# Patient Record
Sex: Female | Born: 1935 | Race: Black or African American | Hispanic: No | Marital: Married | Smoking: Never smoker
Health system: Southern US, Community
[De-identification: ages and names within clinical notes are randomized; demographics above are authoritative.]

## PROBLEM LIST (undated history)

## (undated) DIAGNOSIS — F419 Anxiety disorder, unspecified: Secondary | ICD-10-CM

## (undated) DIAGNOSIS — E119 Type 2 diabetes mellitus without complications: Secondary | ICD-10-CM

## (undated) DIAGNOSIS — H409 Unspecified glaucoma: Secondary | ICD-10-CM

## (undated) DIAGNOSIS — E079 Disorder of thyroid, unspecified: Secondary | ICD-10-CM

## (undated) DIAGNOSIS — H9190 Unspecified hearing loss, unspecified ear: Secondary | ICD-10-CM

## (undated) DIAGNOSIS — I10 Essential (primary) hypertension: Secondary | ICD-10-CM

## (undated) HISTORY — PX: CATARACT EXTRACTION: SUR2

---

## 2003-12-16 ENCOUNTER — Ambulatory Visit: Payer: Self-pay | Admitting: Unknown Physician Specialty

## 2004-11-26 ENCOUNTER — Emergency Department: Payer: Self-pay | Admitting: Internal Medicine

## 2004-12-17 ENCOUNTER — Ambulatory Visit: Payer: Self-pay | Admitting: Unknown Physician Specialty

## 2005-12-21 ENCOUNTER — Ambulatory Visit: Payer: Self-pay | Admitting: Unknown Physician Specialty

## 2006-01-26 ENCOUNTER — Ambulatory Visit: Payer: Self-pay | Admitting: Unknown Physician Specialty

## 2006-12-26 ENCOUNTER — Ambulatory Visit: Payer: Self-pay | Admitting: Unknown Physician Specialty

## 2007-12-28 ENCOUNTER — Ambulatory Visit: Payer: Self-pay | Admitting: Unknown Physician Specialty

## 2008-12-30 ENCOUNTER — Ambulatory Visit: Payer: Self-pay | Admitting: Unknown Physician Specialty

## 2009-12-31 ENCOUNTER — Ambulatory Visit: Payer: Self-pay | Admitting: Unknown Physician Specialty

## 2010-03-20 ENCOUNTER — Ambulatory Visit: Payer: Self-pay | Admitting: Ophthalmology

## 2010-04-01 ENCOUNTER — Ambulatory Visit: Payer: Self-pay | Admitting: Ophthalmology

## 2011-01-21 ENCOUNTER — Ambulatory Visit: Payer: Self-pay | Admitting: Unknown Physician Specialty

## 2011-02-01 ENCOUNTER — Ambulatory Visit: Payer: Self-pay | Admitting: Unknown Physician Specialty

## 2012-02-07 ENCOUNTER — Ambulatory Visit: Payer: Self-pay | Admitting: Unknown Physician Specialty

## 2012-09-09 ENCOUNTER — Emergency Department: Payer: Self-pay | Admitting: Internal Medicine

## 2012-11-28 ENCOUNTER — Emergency Department: Payer: Self-pay | Admitting: Emergency Medicine

## 2013-02-07 ENCOUNTER — Ambulatory Visit: Payer: Self-pay | Admitting: Internal Medicine

## 2013-07-31 DIAGNOSIS — E039 Hypothyroidism, unspecified: Secondary | ICD-10-CM | POA: Diagnosis present

## 2013-07-31 DIAGNOSIS — I10 Essential (primary) hypertension: Secondary | ICD-10-CM | POA: Diagnosis present

## 2013-12-13 ENCOUNTER — Emergency Department: Payer: Self-pay | Admitting: Emergency Medicine

## 2014-02-26 ENCOUNTER — Ambulatory Visit: Payer: Self-pay | Admitting: Internal Medicine

## 2014-11-21 ENCOUNTER — Other Ambulatory Visit: Payer: Self-pay | Admitting: Internal Medicine

## 2014-11-21 DIAGNOSIS — Z1231 Encounter for screening mammogram for malignant neoplasm of breast: Secondary | ICD-10-CM

## 2014-12-26 DIAGNOSIS — E119 Type 2 diabetes mellitus without complications: Secondary | ICD-10-CM

## 2015-01-18 ENCOUNTER — Encounter: Payer: Self-pay | Admitting: Emergency Medicine

## 2015-01-18 ENCOUNTER — Emergency Department
Admission: EM | Admit: 2015-01-18 | Discharge: 2015-01-18 | Disposition: A | Discharge status: Home / Self Care | Payer: Commercial Managed Care - HMO | Attending: Emergency Medicine | Admitting: Emergency Medicine

## 2015-01-18 DIAGNOSIS — E119 Type 2 diabetes mellitus without complications: Secondary | ICD-10-CM | POA: Insufficient documentation

## 2015-01-18 DIAGNOSIS — H269 Unspecified cataract: Secondary | ICD-10-CM | POA: Diagnosis not present

## 2015-01-18 DIAGNOSIS — H10021 Other mucopurulent conjunctivitis, right eye: Secondary | ICD-10-CM | POA: Insufficient documentation

## 2015-01-18 DIAGNOSIS — H5711 Ocular pain, right eye: Secondary | ICD-10-CM | POA: Diagnosis present

## 2015-01-18 HISTORY — DX: Disorder of thyroid, unspecified: E07.9

## 2015-01-18 HISTORY — DX: Type 2 diabetes mellitus without complications: E11.9

## 2015-01-18 MED ORDER — POLYMYXIN B-TRIMETHOPRIM 10000-0.1 UNIT/ML-% OP SOLN: 2.0000 [drp] | Freq: Four times a day (QID) | OPHTHALMIC | Status: DC

## 2015-01-18 NOTE — Discharge Instructions (Signed)
How to Use Eye Drops and Eye Ointments  HOW TO APPLY EYE DROPS  Follow these steps when applying eye drops:  1. Wash your hands.  2. Tilt your head back.  3. Put a finger under your eye and use it to gently pull your lower lid downward. Keep that finger in place.  4. Using your other hand, hold the dropper between your thumb and index finger.  5. Position the dropper just over the edge of the lower lid. Hold it as close to your eye as you can without touching the dropper to your eye.  6. Steady your hand. One way to do this is to lean your index finger against your brow.  7. Look up.  8. Slowly and gently squeeze one drop of medicine into your eye.  9. Close your eye.  10. Place a finger between your lower eyelid and your nose. Press gently for 2 minutes. This increases the amount of time that the medicine is exposed to the eye. It also reduces side effects that can develop if the drop gets into the bloodstream through the nose.  HOW TO APPLY EYE OINTMENTS  Follow these steps when applying eye ointments:  1. Wash your hands.  2. Put a finger under your eye and use it to gently pull your lower lid downward. Keep that finger in place.  3. Using your other hand, place the tip of the tube between your thumb and index finger with the remaining fingers braced against your cheek or nose.  4. Hold the tube just over the edge of your lower lid without touching the tube to your lid or eyeball.  5. Look up.  6. Line the inner part of your lower lid with ointment.  7. Gently pull up on your upper lid and look down. This will force the ointment to spread over the surface of the eye.  8. Release the upper lid.  9. If you can, close your eyes for 1-2 minutes.  Do not rub your eyes. If you applied the ointment correctly, your vision will be blurry for a few minutes. This is normal.  ADDITIONAL INFORMATION   Make sure to use the eye drops or ointment as told by your health care provider.   If you have been told to use both eye  drops and an eye ointment, apply the eye drops first, then wait 3-4 minutes before you apply the ointment.   Try not to touch the tip of the dropper or tube to your eye. A dropper or tube that has touched the eye can become contaminated.     This information is not intended to replace advice given to you by your health care provider. Make sure you discuss any questions you have with your health care provider.     Document Released: 05/17/2000 Document Revised: 06/25/2014 Document Reviewed: 02/04/2014  Elsevier Interactive Patient Education 2016 [employer]

## 2015-01-18 NOTE — ED Provider Notes (Signed)
[facility] Emergency Department Provider Note  ____________________________________________  Time seen: Approximately 5:12 PM  I have reviewed the triage vital signs and the nursing notes.   HISTORY  Chief Complaint Eye Pain    HPI Ashley Cantrell is a 81 y.o. female who presents to the emergency department complaining of right eye soreness and drainage 3 days. She states that her eye has become red, it is draining "pussy material", and is "stinging." Patient does endorse a history of cataracts to right eye. She denies any "deep eye pain." Patient has not tried anything over-the-counter prior to arrival. She denies any visual acuity changes. Denies any other symptoms or complaints.   Past Medical History  Diagnosis Date  . Diabetes mellitus without complication (HCC)   . Thyroid disease     There are no active problems to display for this patient.   Past Surgical History  Procedure Laterality Date  . Cataract extraction      Current Outpatient Rx  Name  Route  Sig  Dispense  Refill  . trimethoprim-polymyxin b (POLYTRIM) ophthalmic solution   Right Eye   Place 2 drops into the right eye every 6 (six) hours.   10 mL   0     Allergies Review of patient's allergies indicates no known allergies.  History reviewed. No pertinent family history.  Social History Social History  Substance Use Topics  . Smoking status: Never Smoker   . Smokeless tobacco: None  . Alcohol Use: No    Review of Systems Constitutional: No fever/chills Eyes: No visual changes. Endorses right eye soreness, drainage, and redness. ENT: No sore throat. Cardiovascular: Denies chest pain. Respiratory: Denies shortness of breath. Gastrointestinal: No abdominal pain.  No nausea, no vomiting.  No diarrhea.  No constipation. Genitourinary: Negative for dysuria. Musculoskeletal: Negative for back pain. Skin: Negative for rash. Neurological: Negative for headaches, focal  weakness or numbness.  10-point ROS otherwise negative.  ____________________________________________   PHYSICAL EXAM:  VITAL SIGNS: ED Triage Vitals  Enc Vitals Group     BP 01/18/15 1633 136/53 mmHg     Pulse Rate 01/18/15 1633 75     Resp 01/18/15 1633 20     Temp 01/18/15 1633 97.6 F (36.4 C)     Temp Source 01/18/15 1633 Oral     SpO2 01/18/15 1633 100 %     Weight 01/18/15 1633 110 lb (49.896 kg)     Height 01/18/15 1633 5\' 5"  (1.651 m)     Head Cir --      Peak Flow --      Pain Score 01/18/15 1636 5     Pain Loc --      Pain Edu? --      Excl. in GC? --     Constitutional: Alert and oriented. Well appearing and in no acute distress. Eyes: Conjunctivae are erythematous. Mucopurulent drainage is noted to lower eyelid. Funduscopic exam reveals cataract in right eye. Left eye is unremarkable with conjunctiva non-erythematous and funduscopic exam within normal limits.Marland Kitchen. PERRL. EOMI. Head: Atraumatic. Nose: No congestion/rhinnorhea. Mouth/Throat: Mucous membranes are moist.  Oropharynx non-erythematous. Neck: No stridor.   Cardiovascular: Normal rate, regular rhythm. Grossly normal heart sounds.  Good peripheral circulation. Respiratory: Normal respiratory effort.  No retractions. Lungs CTAB. Gastrointestinal: Soft and nontender. No distention. No abdominal bruits. No CVA tenderness. Musculoskeletal: No lower extremity tenderness nor edema.  No joint effusions. Neurologic:  Normal speech and language. No gross focal neurologic deficits are appreciated. No  gait instability. Skin:  Skin is warm, dry and intact. No rash noted. Psychiatric: Mood and affect are normal. Speech and behavior are normal.  ____________________________________________   LABS (all labs ordered are listed, but only abnormal results are displayed)  Labs Reviewed - No data to  display ____________________________________________  EKG   ____________________________________________  RADIOLOGY   ____________________________________________   PROCEDURES  Procedure(s) performed: None  Critical Care performed: No  ____________________________________________   INITIAL IMPRESSION / ASSESSMENT AND PLAN / ED COURSE  Pertinent labs & imaging results that were available during my care of the patient were reviewed by me and considered in my medical decision making (see chart for details).  Patient's history, symptoms, physical exam are consistent with bacterial conjunctivitis to the right eye. Advised patient of findings and diagnosis and she verbalizes understanding of same. The patient will be discharged home with antibiotic eyedrops with instructions to use for at least a week or 2 days on and last symptoms. Patient verbalizes understanding of the diagnosis and treatment plan verbalizes compliance with same. Patient is to follow-up with primary care or ophthalmologist should symptoms persist past treatment course. ____________________________________________   FINAL CLINICAL IMPRESSION(S) / ED DIAGNOSES  Final diagnoses:  Mucopurulent conjunctivitis, right      Racheal Patches, PA-C 01/18/15 1757  Loleta Rose, MD 01/18/15 2322

## 2015-01-18 NOTE — ED Notes (Signed)
Pt complains of soreness to right eye for 3 days and is worse in the morning.

## 2015-03-05 ENCOUNTER — Ambulatory Visit: Payer: Self-pay

## 2015-03-13 ENCOUNTER — Ambulatory Visit
Admission: RE | Admit: 2015-03-13 | Discharge: 2015-03-13 | Disposition: A | Discharge status: Home / Self Care | Payer: Medicare HMO | Source: Ambulatory Visit | Attending: Internal Medicine | Admitting: Internal Medicine

## 2015-03-13 DIAGNOSIS — Z1231 Encounter for screening mammogram for malignant neoplasm of breast: Secondary | ICD-10-CM | POA: Diagnosis present

## 2016-04-20 ENCOUNTER — Other Ambulatory Visit: Payer: Self-pay | Admitting: Internal Medicine

## 2016-04-20 DIAGNOSIS — Z1231 Encounter for screening mammogram for malignant neoplasm of breast: Secondary | ICD-10-CM

## 2016-05-17 ENCOUNTER — Ambulatory Visit
Admission: RE | Admit: 2016-05-17 | Discharge: 2016-05-17 | Disposition: A | Discharge status: Home / Self Care | Payer: Medicare HMO | Source: Ambulatory Visit | Attending: Internal Medicine | Admitting: Internal Medicine

## 2016-05-17 DIAGNOSIS — Z1231 Encounter for screening mammogram for malignant neoplasm of breast: Secondary | ICD-10-CM | POA: Insufficient documentation

## 2016-10-27 ENCOUNTER — Encounter: Payer: Self-pay | Admitting: Emergency Medicine

## 2016-10-27 ENCOUNTER — Emergency Department: Payer: Medicare HMO

## 2016-10-27 ENCOUNTER — Inpatient Hospital Stay
Admission: EM | Admit: 2016-10-27 | Discharge: 2016-10-30 | DRG: 482 | Disposition: A | Discharge status: Home Health | Payer: Medicare HMO | Attending: Specialist | Admitting: Specialist

## 2016-10-27 DIAGNOSIS — Y92000 Kitchen of unspecified non-institutional (private) residence as  the place of occurrence of the external cause: Secondary | ICD-10-CM | POA: Diagnosis not present

## 2016-10-27 DIAGNOSIS — Z7982 Long term (current) use of aspirin: Secondary | ICD-10-CM

## 2016-10-27 DIAGNOSIS — M810 Age-related osteoporosis without current pathological fracture: Secondary | ICD-10-CM | POA: Diagnosis present

## 2016-10-27 DIAGNOSIS — H409 Unspecified glaucoma: Secondary | ICD-10-CM | POA: Diagnosis present

## 2016-10-27 DIAGNOSIS — W1830XA Fall on same level, unspecified, initial encounter: Secondary | ICD-10-CM | POA: Diagnosis present

## 2016-10-27 DIAGNOSIS — H9190 Unspecified hearing loss, unspecified ear: Secondary | ICD-10-CM | POA: Diagnosis present

## 2016-10-27 DIAGNOSIS — I10 Essential (primary) hypertension: Secondary | ICD-10-CM | POA: Diagnosis present

## 2016-10-27 DIAGNOSIS — E039 Hypothyroidism, unspecified: Secondary | ICD-10-CM | POA: Diagnosis present

## 2016-10-27 DIAGNOSIS — E119 Type 2 diabetes mellitus without complications: Secondary | ICD-10-CM | POA: Diagnosis present

## 2016-10-27 DIAGNOSIS — F419 Anxiety disorder, unspecified: Secondary | ICD-10-CM | POA: Diagnosis present

## 2016-10-27 DIAGNOSIS — Z419 Encounter for procedure for purposes other than remedying health state, unspecified: Secondary | ICD-10-CM

## 2016-10-27 DIAGNOSIS — E785 Hyperlipidemia, unspecified: Secondary | ICD-10-CM | POA: Diagnosis present

## 2016-10-27 DIAGNOSIS — S72002A Fracture of unspecified part of neck of left femur, initial encounter for closed fracture: Secondary | ICD-10-CM | POA: Diagnosis present

## 2016-10-27 DIAGNOSIS — Z79899 Other long term (current) drug therapy: Secondary | ICD-10-CM

## 2016-10-27 DIAGNOSIS — S72009A Fracture of unspecified part of neck of unspecified femur, initial encounter for closed fracture: Secondary | ICD-10-CM | POA: Diagnosis present

## 2016-10-27 HISTORY — DX: Essential (primary) hypertension: I10

## 2016-10-27 HISTORY — DX: Unspecified glaucoma: H40.9

## 2016-10-27 HISTORY — DX: Anxiety disorder, unspecified: F41.9

## 2016-10-27 LAB — CBC WITH DIFFERENTIAL/PLATELET
BASOS PCT: 0 %
Basophils Absolute: 0 10*3/uL (ref 0–0.1)
EOS ABS: 0 10*3/uL (ref 0–0.7)
EOS PCT: 0 %
HCT: 38.4 % (ref 35.0–47.0)
Hemoglobin: 13 g/dL (ref 12.0–16.0)
LYMPHS ABS: 1.1 10*3/uL (ref 1.0–3.6)
Lymphocytes Relative: 15 %
MCH: 31.6 pg (ref 26.0–34.0)
MCHC: 34 g/dL (ref 32.0–36.0)
MCV: 93 fL (ref 80.0–100.0)
MONOS PCT: 9 %
Monocytes Absolute: 0.7 10*3/uL (ref 0.2–0.9)
Neutro Abs: 5.7 10*3/uL (ref 1.4–6.5)
Neutrophils Relative %: 76 %
PLATELETS: 272 10*3/uL (ref 150–440)
RBC: 4.13 MIL/uL (ref 3.80–5.20)
RDW: 14.7 % — AB (ref 11.5–14.5)
WBC: 7.5 10*3/uL (ref 3.6–11.0)

## 2016-10-27 LAB — BASIC METABOLIC PANEL
Anion gap: 8 (ref 5–15)
BUN: 17 mg/dL (ref 6–20)
CALCIUM: 9.5 mg/dL (ref 8.9–10.3)
CHLORIDE: 102 mmol/L (ref 101–111)
CO2: 25 mmol/L (ref 22–32)
CREATININE: 0.96 mg/dL (ref 0.44–1.00)
GFR calc non Af Amer: 51 mL/min — ABNORMAL LOW (ref >60)
GFR, EST AFRICAN AMERICAN: 59 mL/min — AB (ref >60)
Glucose, Bld: 152 mg/dL — ABNORMAL HIGH (ref 65–99)
Potassium: 4.4 mmol/L (ref 3.5–5.1)
SODIUM: 135 mmol/L (ref 135–145)

## 2016-10-27 LAB — GLUCOSE, CAPILLARY: Glucose-Capillary: 111 mg/dL — ABNORMAL HIGH (ref 65–99)

## 2016-10-27 MED ORDER — ATORVASTATIN CALCIUM 20 MG PO TABS: 20.0000 mg | ORAL_TABLET | Freq: Every day | ORAL | Status: DC

## 2016-10-27 MED ORDER — AMLODIPINE BESYLATE 5 MG PO TABS: 5.0000 mg | ORAL_TABLET | Freq: Every day | ORAL | Status: DC

## 2016-10-27 MED ORDER — ACETAMINOPHEN 650 MG RE SUPP: 650.0000 mg | Freq: Four times a day (QID) | RECTAL | Status: DC | PRN

## 2016-10-27 MED ORDER — SODIUM CHLORIDE 0.9 % IV SOLN
INTRAVENOUS | Status: DC
  Administered 2016-10-27: 75 mL/h via INTRAVENOUS

## 2016-10-27 MED ORDER — INSULIN ASPART 100 UNIT/ML ~~LOC~~ SOLN
0.0000 [IU] | Freq: Three times a day (TID) | SUBCUTANEOUS | Status: DC
  Administered 2016-10-28: 1 [IU] via SUBCUTANEOUS
  Filled 2016-10-27: qty 1

## 2016-10-27 MED ORDER — HEPARIN SODIUM (PORCINE) 5000 UNIT/ML IJ SOLN
5000.0000 [IU] | Freq: Three times a day (TID) | INTRAMUSCULAR | Status: DC
  Administered 2016-10-27 – 2016-10-28 (×2): 5000 [IU] via SUBCUTANEOUS
  Filled 2016-10-27 (×2): qty 1

## 2016-10-27 MED ORDER — ONDANSETRON HCL 4 MG/2ML IJ SOLN
4.0000 mg | Freq: Four times a day (QID) | INTRAMUSCULAR | Status: DC | PRN
  Administered 2016-10-27: 4 mg via INTRAVENOUS
  Filled 2016-10-27: qty 2

## 2016-10-27 MED ORDER — ONDANSETRON HCL 4 MG PO TABS: 4.0000 mg | ORAL_TABLET | Freq: Four times a day (QID) | ORAL | Status: DC | PRN

## 2016-10-27 MED ORDER — ALPRAZOLAM 0.25 MG PO TABS: 0.2500 mg | ORAL_TABLET | Freq: Three times a day (TID) | ORAL | Status: DC | PRN

## 2016-10-27 MED ORDER — INSULIN ASPART 100 UNIT/ML ~~LOC~~ SOLN: 0.0000 [IU] | Freq: Every day | SUBCUTANEOUS | Status: DC

## 2016-10-27 MED ORDER — ACETAMINOPHEN 325 MG PO TABS: 650.0000 mg | ORAL_TABLET | Freq: Four times a day (QID) | ORAL | Status: DC | PRN

## 2016-10-27 MED ORDER — LEVOTHYROXINE SODIUM 50 MCG PO TABS: 75.0000 ug | ORAL_TABLET | Freq: Every day | ORAL | Status: DC

## 2016-10-27 NOTE — ED Provider Notes (Signed)
[facility] Emergency Department Provider Note   ____________________________________________   I have reviewed the triage vital signs and the nursing notes.   HISTORY  Chief Complaint Fall   History limited by: Not Limited   HPI Ashley Cantrell is a 83 y.o. female who presents to the emergency department today after a fall. The patient states she turned around and reached for her chair. She did not get the children came down. She fell onto her left side. She states she hit her left elbow and her left hip. She did have pain in that left hip. It is worse with walking. She denies any significant pain in the elbow. She denies hitting her head. She denies any recent illness.    Past Medical History:  Diagnosis Date  . Diabetes mellitus without complication (HCC)   . Thyroid disease     There are no active problems to display for this patient.   Past Surgical History:  Procedure Laterality Date  . CATARACT EXTRACTION      Prior to Admission medications   Medication Sig Start Date End Date Taking? Authorizing Provider  trimethoprim-polymyxin b (POLYTRIM) ophthalmic solution Place 2 drops into the right eye every 6 (six) hours. 01/18/15   Cuthriell, Delorise Royals, PA-C    Allergies Patient has no known allergies.  Family History  Problem Relation Age of Onset  . Breast cancer Neg Hx     Social History Social History  Substance Use Topics  . Smoking status: Never Smoker  . Smokeless tobacco: Not on file  . Alcohol use No    Review of Systems Constitutional: No fever/chills Eyes: No visual changes. ENT: Chronic hearing difficulty Cardiovascular: Denies chest pain. Respiratory: Denies shortness of breath. Gastrointestinal: No abdominal pain.  No nausea, no vomiting.  No diarrhea.   Genitourinary: Negative for dysuria. Musculoskeletal: Positive for left hip pain. Skin: Negative for rash. Neurological: Negative for headaches, focal weakness or  numbness.  ____________________________________________   PHYSICAL EXAM:  VITAL SIGNS: ED Triage Vitals [10/27/16 1854]  Enc Vitals Group     BP      Pulse      Resp      Temp      Temp src      SpO2      Weight 110 lb (49.9 kg)     Height      Head Circumference      Peak Flow      Pain Score      Pain Loc      Pain Edu?      Excl. in GC?      Constitutional: Alert and oriented. Well appearing and in no distress. Eyes: Conjunctivae are normal.  ENT   Head: Normocephalic and atraumatic.   Nose: No congestion/rhinnorhea.   Mouth/Throat: Mucous membranes are moist.   Neck: No stridor. Hematological/Lymphatic/Immunilogical: No cervical lymphadenopathy. Cardiovascular: Normal rate, regular rhythm.  No murmurs, rubs, or gallops.  Respiratory: Normal respiratory effort without tachypnea nor retractions. Breath sounds are clear and equal bilaterally. No wheezes/rales/rhonchi. Gastrointestinal: Soft and non tender. No rebound. No guarding.  Genitourinary: Deferred Musculoskeletal: Normal range of motion in all extremities. No lower extremity edema. Neurologic:  Normal speech and language. No gross focal neurologic deficits are appreciated.  Skin:  Skin is warm, dry and intact. No rash noted. Psychiatric: Mood and affect are normal. Speech and behavior are normal. Patient exhibits appropriate insight and judgment.  ____________________________________________    LABS (pertinent positives/negatives)  Labs  Reviewed  CBC WITH DIFFERENTIAL/PLATELET - Abnormal; Notable for the following:       Result Value   RDW 14.7 (*)    All other components within normal limits  BASIC METABOLIC PANEL - Abnormal; Notable for the following:    Glucose, Bld 152 (*)    GFR calc non Af Amer 51 (*)    GFR calc Af Amer 59 (*)    All other components within normal limits     ____________________________________________   EKG  I, Phineas SemenGraydon Yalanda Soderman, attending physician,  personally viewed and interpreted this EKG  EKG Time: 1952 Rate: 87 Rhythm: sinus rhythm Axis: normal axis Intervals: qtc 430 QRS: narrow ST changes: no st elevation Impression: normal ekg   ____________________________________________    RADIOLOGY  Left hip IMPRESSION: Impacted left femoral neck fracture with minimal displacement.  CXR IMPRESSION: No active disease.   ____________________________________________   PROCEDURES  Procedures  ____________________________________________   INITIAL IMPRESSION / ASSESSMENT AND PLAN / ED COURSE  Pertinent labs & imaging results that were available during my care of the patient were reviewed by me and considered in my medical decision making (see chart for details).  Patient presented to the emergency department today because of concerns for left hip pain after a fall. X-rays do show a fracture. Plan will be for admission to the hospitalist service. I did contact Dr. Odis LusterBowers with  orthopedic surgery.  ____________________________________________   FINAL CLINICAL IMPRESSION(S) / ED DIAGNOSES  Final diagnoses:  Closed fracture of left hip, initial encounter [facility](HCC)     Note: This dictation was prepared with Dragon dictation. Any transcriptional errors that result from this process are unintentional     Phineas SemenGoodman, Wajiha Versteeg, MD 10/27/16 2049

## 2016-10-27 NOTE — ED Triage Notes (Signed)
Pt arrived via EMS from a friend's house for reports of a fall and left hip pain. Pt reports she tripped. Pt reports left hip pain upon standing. EMS reports no outward rotation or shortening noted. EMS reports VSS.

## 2016-10-27 NOTE — H&P (Signed)
Ashley Cantrell is an 83 y.o. female.   Chief Complaint: Fall HPI: This is a 83 year old female who today fell in her kitchen and suffered a left hip fracture. She is very hard of hearing and her daughter is completely deaf and can only communicate with sign language or writing. The patient has had no symptoms of chest pain or shortness of breath lately. She is functional in her activities of daily living. Hospitalist services were consulted for admission.  Past Medical History:  Diagnosis Date  . Diabetes mellitus without complication ([place])   . Thyroid disease     Past Surgical History:  Procedure Laterality Date  . CATARACT EXTRACTION      Family History  Problem Relation Age of Onset  . Breast cancer Neg Hx    Social History:  reports that she has never smoked. She does not have any smokeless tobacco history on file. She reports that she does not drink alcohol. Her drug history is not on file.  Allergies: No Known Allergies   (Not in a hospital admission)  Results for orders placed or performed during the hospital encounter of 10/27/16 (from the past 48 hour(s))  CBC with Differential     Status: Abnormal   Collection Time: 10/27/16  7:40 PM  Result Value Ref Range   WBC 7.5 3.6 - 11.0 K/uL   RBC 4.13 3.80 - 5.20 MIL/uL   Hemoglobin 13.0 12.0 - 16.0 g/dL   HCT 38.4 35.0 - 47.0 %   MCV 93.0 80.0 - 100.0 fL   MCH 31.6 26.0 - 34.0 pg   MCHC 34.0 32.0 - 36.0 g/dL   RDW 14.7 (H) 11.5 - 14.5 %   Platelets 272 150 - 440 K/uL   Neutrophils Relative % 76 %   Neutro Abs 5.7 1.4 - 6.5 K/uL   Lymphocytes Relative 15 %   Lymphs Abs 1.1 1.0 - 3.6 K/uL   Monocytes Relative 9 %   Monocytes Absolute 0.7 0.2 - 0.9 K/uL   Eosinophils Relative 0 %   Eosinophils Absolute 0.0 0 - 0.7 K/uL   Basophils Relative 0 %   Basophils Absolute 0.0 0 - 0.1 K/uL  Basic metabolic panel     Status: Abnormal   Collection Time: 10/27/16  7:40 PM  Result Value Ref Range   Sodium 135 135 - 145 mmol/L    Potassium 4.4 3.5 - 5.1 mmol/L   Chloride 102 101 - 111 mmol/L   CO2 25 22 - 32 mmol/L   Glucose, Bld 152 (H) 65 - 99 mg/dL   BUN 17 6 - 20 mg/dL   Creatinine, Ser 0.96 0.44 - 1.00 mg/dL   Calcium 9.5 8.9 - 10.3 mg/dL   GFR calc non Af Amer 51 (L) >60 mL/min   GFR calc Af Amer 59 (L) >60 mL/min    Comment: (NOTE) The eGFR has been calculated using the CKD EPI equation. This calculation has not been validated in all clinical situations. eGFR's persistently <60 mL/min signify possible Chronic Kidney Disease.    Anion gap 8 5 - 15   Dg Chest Portable 1 View  Result Date: 10/27/2016 CLINICAL DATA:  Preop, hip fracture EXAM: PORTABLE CHEST 1 VIEW COMPARISON:  10/27/2016 FINDINGS: Lungs demonstrate no consolidation or effusion. Heart size within normal limits. Aortic atherosclerosis. No pneumothorax. IMPRESSION: No active disease. Electronically Signed   By: Donavan Foil M.D.   On: 10/27/2016 20:01   Dg Hip Unilat W Or Wo Pelvis 2-3 Views Left  Result Date: 10/27/2016 CLINICAL DATA:  Trip and fall with left hip pain. EXAM: DG HIP (WITH OR WITHOUT PELVIS) 2-3V LEFT COMPARISON:  None. FINDINGS: Impacted fracture of the left femoral neck with minimal displacement. No significant angulation. New migration of the femoral shaft. The remainder the bony pelvis is intact without additional acute fracture. Age related degenerative change of both hips and sacroiliac joints. IMPRESSION: Impacted left femoral neck fracture with minimal displacement. Electronically Signed   By: Jeb Levering M.D.   On: 10/27/2016 19:27    Review of Systems  Constitutional: Negative for fever.  HENT: Positive for hearing loss.   Eyes: Negative for blurred vision.  Respiratory: Negative for cough.   Cardiovascular: Negative for chest pain.  Gastrointestinal: Negative for nausea and vomiting.  Genitourinary: Negative for dysuria.  Musculoskeletal: Negative for joint pain.  Skin: Negative for rash.  Neurological:  Negative for dizziness.    Weight 49.9 kg (110 lb). Physical Exam  Constitutional: She is oriented to person, place, and time. She appears well-developed and well-nourished. No distress.  HENT:  Head: Normocephalic and atraumatic.  Mouth/Throat: Oropharynx is clear and moist. No oropharyngeal exudate.  Eyes: Pupils are equal, round, and reactive to light. EOM are normal. No scleral icterus.  Neck: Normal range of motion. Neck supple. No JVD present. No tracheal deviation present. No thyromegaly present.  Cardiovascular: Normal rate.   Murmur heard. Respiratory: Effort normal and breath sounds normal. No respiratory distress. She exhibits no tenderness.  GI: Soft. Bowel sounds are normal. She exhibits no distension and no mass. There is no rebound.  Musculoskeletal: She exhibits no edema or tenderness.  Lymphadenopathy:    She has no cervical adenopathy.  Neurological: She is alert and oriented to person, place, and time. No cranial nerve deficit.  Hard of hearing  Skin: Skin is warm and dry.     Assessment and plan  1. Left hip fracture. Patient suffered a fall and a left hip fracture. We'll go ahead and admit her put her on DVT prophylaxis. Pain control. Consult orthopedics as she will likely need surgery. Patient has normal EKG and has had no cardiac symptoms. She is at some risk because of her age. Discussed this with patient and her daughter. They understand. Recommend proceed with surgery. 2. Diabetes. This is diet-controlled. We'll go ahead and put her on a diabetic diet and sliding scale insulin. 3. Hypertension. Continue her current medications. 4. Hypothyroidism. We'll continue her Synthroid  Total time spent 45 minutes   Baxter Hire, MD 10/27/2016, 8:40 PM

## 2016-10-28 ENCOUNTER — Inpatient Hospital Stay: Payer: Medicare HMO | Admitting: Certified Registered"

## 2016-10-28 ENCOUNTER — Ambulatory Visit: Payer: Self-pay | Admitting: Orthopedic Surgery

## 2016-10-28 ENCOUNTER — Encounter
Admission: EM | Disposition: A | Discharge status: Home Health | Payer: Self-pay | Source: Home / Self Care | Attending: Specialist

## 2016-10-28 ENCOUNTER — Inpatient Hospital Stay: Payer: Medicare HMO

## 2016-10-28 DIAGNOSIS — S72002A Fracture of unspecified part of neck of left femur, initial encounter for closed fracture: Secondary | ICD-10-CM | POA: Diagnosis not present

## 2016-10-28 HISTORY — PX: HIP PINNING,CANNULATED: SHX1758

## 2016-10-28 LAB — CBC
HEMATOCRIT: 36.1 % (ref 35.0–47.0)
HEMOGLOBIN: 12.6 g/dL (ref 12.0–16.0)
MCH: 32 pg (ref 26.0–34.0)
MCHC: 34.8 g/dL (ref 32.0–36.0)
MCV: 91.9 fL (ref 80.0–100.0)
Platelets: 256 10*3/uL (ref 150–440)
RBC: 3.93 MIL/uL (ref 3.80–5.20)
RDW: 14.9 % — ABNORMAL HIGH (ref 11.5–14.5)
WBC: 7.1 10*3/uL (ref 3.6–11.0)

## 2016-10-28 LAB — BASIC METABOLIC PANEL
ANION GAP: 6 (ref 5–15)
BUN: 16 mg/dL (ref 6–20)
CO2: 25 mmol/L (ref 22–32)
Calcium: 8.9 mg/dL (ref 8.9–10.3)
Chloride: 105 mmol/L (ref 101–111)
Creatinine, Ser: 0.85 mg/dL (ref 0.44–1.00)
GFR calc Af Amer: >60 mL/min (ref >60)
GFR, EST NON AFRICAN AMERICAN: 59 mL/min — AB (ref >60)
GLUCOSE: 153 mg/dL — AB (ref 65–99)
POTASSIUM: 3.5 mmol/L (ref 3.5–5.1)
Sodium: 136 mmol/L (ref 135–145)

## 2016-10-28 LAB — GLUCOSE, CAPILLARY
GLUCOSE-CAPILLARY: 137 mg/dL — AB (ref 65–99)
Glucose-Capillary: 96 mg/dL (ref 65–99)
Glucose-Capillary: 93 mg/dL (ref 65–99)
Glucose-Capillary: 90 mg/dL (ref 65–99)

## 2016-10-28 LAB — MRSA PCR SCREENING: MRSA BY PCR: NEGATIVE

## 2016-10-28 SURGERY — FIXATION, FEMUR, NECK, PERCUTANEOUS, USING SCREW
Anesthesia: Spinal | Site: Hip | Laterality: Left | Wound class: Clean

## 2016-10-28 MED ORDER — LEVOTHYROXINE SODIUM 50 MCG PO TABS
75.0000 ug | ORAL_TABLET | Freq: Every day | ORAL | Status: DC
  Administered 2016-10-29: 75 ug via ORAL
  Filled 2016-10-28: qty 1

## 2016-10-28 MED ORDER — DOCUSATE SODIUM 100 MG PO CAPS
100.0000 mg | ORAL_CAPSULE | Freq: Two times a day (BID) | ORAL | Status: DC
  Administered 2016-10-28 – 2016-10-30 (×5): 100 mg via ORAL
  Filled 2016-10-28 (×5): qty 1

## 2016-10-28 MED ORDER — CHLORHEXIDINE GLUCONATE 4 % EX LIQD
60.0000 mL | Freq: Once | CUTANEOUS | Status: AC
  Administered 2016-10-28: 4 via TOPICAL

## 2016-10-28 MED ORDER — LACTATED RINGERS IV SOLN
INTRAVENOUS | Status: DC
  Administered 2016-10-28: 16:00:00 via INTRAVENOUS

## 2016-10-28 MED ORDER — CEFAZOLIN SODIUM-DEXTROSE 2-4 GM/100ML-% IV SOLN
2.0000 g | INTRAVENOUS | Status: AC
  Administered 2016-10-28: 2 g via INTRAVENOUS
  Filled 2016-10-28: qty 100

## 2016-10-28 MED ORDER — PROPOFOL 10 MG/ML IV BOLUS
INTRAVENOUS | Status: DC | PRN
  Administered 2016-10-28: 20 mg via INTRAVENOUS
  Administered 2016-10-28: 10 mg via INTRAVENOUS

## 2016-10-28 MED ORDER — BUPIVACAINE HCL (PF) 0.5 % IJ SOLN
INTRAMUSCULAR | Status: DC | PRN
  Administered 2016-10-28: 3 mL via INTRATHECAL

## 2016-10-28 MED ORDER — ATORVASTATIN CALCIUM 20 MG PO TABS
20.0000 mg | ORAL_TABLET | Freq: Every day | ORAL | Status: DC
  Administered 2016-10-28 – 2016-10-30 (×3): 20 mg via ORAL
  Filled 2016-10-28 (×3): qty 1

## 2016-10-28 MED ORDER — AMLODIPINE BESYLATE 5 MG PO TABS
5.0000 mg | ORAL_TABLET | Freq: Every day | ORAL | Status: DC
  Administered 2016-10-28 – 2016-10-30 (×3): 5 mg via ORAL
  Filled 2016-10-28 (×3): qty 1

## 2016-10-28 MED ORDER — ASPIRIN EC 81 MG PO TBEC
81.0000 mg | DELAYED_RELEASE_TABLET | Freq: Every day | ORAL | Status: DC
  Administered 2016-10-28 – 2016-10-30 (×3): 81 mg via ORAL
  Filled 2016-10-28 (×3): qty 1

## 2016-10-28 MED ORDER — GLYCOPYRROLATE 0.2 MG/ML IJ SOLN
INTRAMUSCULAR | Status: DC | PRN
  Administered 2016-10-28: 0.1 mg via INTRAVENOUS

## 2016-10-28 MED ORDER — TRANEXAMIC ACID 1000 MG/10ML IV SOLN
1000.0000 mg | INTRAVENOUS | Status: AC
  Administered 2016-10-28: 1000 mg via INTRAVENOUS
  Filled 2016-10-28: qty 10

## 2016-10-28 MED ORDER — HYDROCODONE-ACETAMINOPHEN 5-325 MG PO TABS
1.0000 | ORAL_TABLET | ORAL | Status: DC | PRN
  Filled 2016-10-28: qty 1

## 2016-10-28 MED ORDER — METOCLOPRAMIDE HCL 5 MG/ML IJ SOLN: 5.0000 mg | Freq: Three times a day (TID) | INTRAMUSCULAR | Status: DC | PRN

## 2016-10-28 MED ORDER — BUPIVACAINE HCL (PF) 0.5 % IJ SOLN
INTRAMUSCULAR | Status: AC
  Filled 2016-10-28: qty 10

## 2016-10-28 MED ORDER — SODIUM CHLORIDE 0.9 % IR SOLN
Status: DC | PRN
  Administered 2016-10-28: 300 mL

## 2016-10-28 MED ORDER — KETAMINE HCL 50 MG/ML IJ SOLN
INTRAMUSCULAR | Status: AC
  Filled 2016-10-28: qty 10

## 2016-10-28 MED ORDER — GLYCOPYRROLATE 0.2 MG/ML IJ SOLN
INTRAMUSCULAR | Status: AC
  Filled 2016-10-28: qty 1

## 2016-10-28 MED ORDER — POLYETHYLENE GLYCOL 3350 17 G PO PACK
17.0000 g | PACK | Freq: Every day | ORAL | Status: DC | PRN
  Administered 2016-10-29: 17 g via ORAL
  Filled 2016-10-28: qty 1

## 2016-10-28 MED ORDER — KETAMINE HCL 50 MG/ML IJ SOLN
INTRAMUSCULAR | Status: DC | PRN
  Administered 2016-10-28: 25 mg via INTRAVENOUS

## 2016-10-28 MED ORDER — PROPOFOL 500 MG/50ML IV EMUL
INTRAVENOUS | Status: DC | PRN
  Administered 2016-10-28: 25 ug/kg/min via INTRAVENOUS

## 2016-10-28 MED ORDER — ACETAMINOPHEN 500 MG PO TABS: 1000.0000 mg | ORAL_TABLET | Freq: Once | ORAL | Status: DC

## 2016-10-28 MED ORDER — ONDANSETRON HCL 4 MG PO TABS: 4.0000 mg | ORAL_TABLET | Freq: Four times a day (QID) | ORAL | Status: DC | PRN

## 2016-10-28 MED ORDER — PROPOFOL 10 MG/ML IV BOLUS
INTRAVENOUS | Status: AC
  Filled 2016-10-28: qty 20

## 2016-10-28 MED ORDER — LIDOCAINE HCL (PF) 2 % IJ SOLN
INTRAMUSCULAR | Status: AC
  Filled 2016-10-28: qty 2

## 2016-10-28 MED ORDER — SENNA 8.6 MG PO TABS
1.0000 | ORAL_TABLET | Freq: Two times a day (BID) | ORAL | Status: DC
  Administered 2016-10-28 – 2016-10-30 (×5): 8.6 mg via ORAL
  Filled 2016-10-28 (×5): qty 1

## 2016-10-28 MED ORDER — DORZOLAMIDE HCL-TIMOLOL MAL 2-0.5 % OP SOLN
1.0000 [drp] | Freq: Two times a day (BID) | OPHTHALMIC | Status: DC
  Administered 2016-10-28 – 2016-10-30 (×4): 1 [drp] via OPHTHALMIC
  Filled 2016-10-28: qty 10

## 2016-10-28 MED ORDER — MORPHINE SULFATE (PF) 2 MG/ML IV SOLN: 1.0000 mg | INTRAVENOUS | Status: DC | PRN

## 2016-10-28 MED ORDER — POVIDONE-IODINE 10 % EX SWAB: 2.0000 "application " | Freq: Once | CUTANEOUS | Status: DC

## 2016-10-28 MED ORDER — CELECOXIB 200 MG PO CAPS
200.0000 mg | ORAL_CAPSULE | Freq: Two times a day (BID) | ORAL | Status: DC
  Administered 2016-10-28 – 2016-10-30 (×5): 200 mg via ORAL
  Filled 2016-10-28 (×5): qty 1

## 2016-10-28 MED ORDER — ACETAMINOPHEN 500 MG PO TABS
1000.0000 mg | ORAL_TABLET | Freq: Four times a day (QID) | ORAL | Status: AC
  Administered 2016-10-29 (×2): 1000 mg via ORAL
  Filled 2016-10-28 (×2): qty 2

## 2016-10-28 MED ORDER — DORZOLAMIDE HCL-TIMOLOL MAL 2-0.5 % OP SOLN: 1.0000 [drp] | Freq: Two times a day (BID) | OPHTHALMIC | Status: DC

## 2016-10-28 MED ORDER — FLEET ENEMA 7-19 GM/118ML RE ENEM
1.0000 | ENEMA | Freq: Once | RECTAL | Status: AC | PRN
  Administered 2016-10-30: 1 via RECTAL

## 2016-10-28 MED ORDER — CEFAZOLIN SODIUM-DEXTROSE 1-4 GM/50ML-% IV SOLN
1.0000 g | Freq: Four times a day (QID) | INTRAVENOUS | Status: AC
  Administered 2016-10-28 – 2016-10-29 (×3): 1 g via INTRAVENOUS
  Filled 2016-10-28 (×4): qty 50

## 2016-10-28 MED ORDER — ONDANSETRON HCL 4 MG/2ML IJ SOLN: 4.0000 mg | Freq: Four times a day (QID) | INTRAMUSCULAR | Status: DC | PRN

## 2016-10-28 MED ORDER — METOCLOPRAMIDE HCL 10 MG PO TABS: 5.0000 mg | ORAL_TABLET | Freq: Three times a day (TID) | ORAL | Status: DC | PRN

## 2016-10-28 MED ORDER — BISACODYL 10 MG RE SUPP
10.0000 mg | Freq: Every day | RECTAL | Status: DC | PRN
  Filled 2016-10-28: qty 1

## 2016-10-28 MED ORDER — PHENYLEPHRINE HCL 10 MG/ML IJ SOLN
INTRAMUSCULAR | Status: DC | PRN
  Administered 2016-10-28: 100 ug via INTRAVENOUS

## 2016-10-28 SURGICAL SUPPLY — 31 items
BIT DRILL CANN LRG QC 5X300 (BIT) ×3 IMPLANT
BLADE SURG SZ10 CARB STEEL (BLADE) ×3 IMPLANT
BNDG COHESIVE 6X5 TAN STRL LF (GAUZE/BANDAGES/DRESSINGS) ×6 IMPLANT
BRUSH SCRUB EZ  4% CHG (MISCELLANEOUS) ×2
BRUSH SCRUB EZ 4% CHG (MISCELLANEOUS) ×1 IMPLANT
CANISTER SUCT 1200ML W/VALVE (MISCELLANEOUS) ×3 IMPLANT
CHLORAPREP W/TINT 26ML (MISCELLANEOUS) ×3 IMPLANT
DRAPE U-SHAPE 47X51 STRL (DRAPES) ×3 IMPLANT
DRSG OPSITE POSTOP 3X4 (GAUZE/BANDAGES/DRESSINGS) ×3 IMPLANT
ELECT REM PT RETURN 9FT ADLT (ELECTROSURGICAL) ×3
ELECTRODE REM PT RTRN 9FT ADLT (ELECTROSURGICAL) ×1 IMPLANT
GAUZE PETRO XEROFOAM 1X8 (MISCELLANEOUS) IMPLANT
GAUZE SPONGE 4X4 12PLY STRL (GAUZE/BANDAGES/DRESSINGS) IMPLANT
GLOVE INDICATOR 8.0 STRL GRN (GLOVE) ×3 IMPLANT
GLOVE SURG ORTHO 8.0 STRL STRW (GLOVE) ×6 IMPLANT
GOWN STRL REUS W/ TWL LRG LVL3 (GOWN DISPOSABLE) ×1 IMPLANT
GOWN STRL REUS W/ TWL XL LVL3 (GOWN DISPOSABLE) ×1 IMPLANT
GOWN STRL REUS W/TWL LRG LVL3 (GOWN DISPOSABLE) ×2
GOWN STRL REUS W/TWL XL LVL3 (GOWN DISPOSABLE) ×2
GUIDEWIRE THREADED 2.8 (WIRE) ×9 IMPLANT
HOLDER FOLEY CATH W/STRAP (MISCELLANEOUS) IMPLANT
KIT RM TURNOVER CYSTO AR (KITS) ×3 IMPLANT
MAT BLUE FLOOR 46X72 FLO (MISCELLANEOUS) ×3 IMPLANT
NS IRRIG 1000ML POUR BTL (IV SOLUTION) ×3 IMPLANT
PACK HIP COMPR (MISCELLANEOUS) ×3 IMPLANT
SCREW 6.5MM CANN 32X80 (Screw) ×3 IMPLANT
SCREW CANN 6.5X75MM (Screw) ×6 IMPLANT
STAPLER SKIN PROX 35W (STAPLE) ×3 IMPLANT
SUT VIC AB 2-0 CT2 27 (SUTURE) ×3 IMPLANT
TRAY FOLEY W/METER SILVER 16FR (SET/KITS/TRAYS/PACK) IMPLANT
WATER STERILE IRR 1000ML POUR (IV SOLUTION) ×3 IMPLANT

## 2016-10-28 NOTE — NC FL2 (Signed)
[place] MEDICAID FL2 LEVEL OF CARE SCREENING TOOL     IDENTIFICATION  Patient Name: Ashley Cantrell Birthdate: 1925-04-01 Sex: female Admission Date (Current Location): 10/27/2016  [place] and [place] Number:  Chiropodist and Address:  [facility], [address] [place], [place] [address]      Provider Number: [id number]  Attending Physician Name and Address:  Houston Siren, MD  Relative Name and Phone Number:       Current Level of Care: Hospital Recommended Level of Care: Skilled Nursing Facility Prior Approval Number:    Date Approved/Denied:   PASRR Number:  [id number])  Discharge Plan: SNF    Current Diagnoses: Patient Active Problem List   Diagnosis Date Noted  . Hip fracture (HCC) 10/27/2016    Orientation RESPIRATION BLADDER Height & Weight     Self, Time, Situation, Place  Normal Continent Weight: 118 lb 14.4 oz (53.9 kg) Height:   (165.1 cm)  BEHAVIORAL SYMPTOMS/MOOD NEUROLOGICAL BOWEL NUTRITION STATUS      Continent Diet (Regular Diet )  AMBULATORY STATUS COMMUNICATION OF NEEDS Skin   Extensive Assist Verbally Surgical wounds (Incision: Left Hip. )                       Personal Care Assistance Level of Assistance  Bathing, Feeding, Dressing Bathing Assistance: Limited assistance Feeding assistance: Independent Dressing Assistance: Limited assistance     Functional Limitations Info  Sight, Hearing, Speech Sight Info: Adequate Hearing Info: Impaired Speech Info: Adequate    SPECIAL CARE FACTORS FREQUENCY  PT (By licensed PT), OT (By licensed OT)     PT Frequency:  (5) OT Frequency:  (5)            Contractures      Additional Factors Info  Code Status, Allergies Code Status Info:  (Full Code. ) Allergies Info:  (No Known Allergies. )           Current Medications (10/28/2016):  This is the current hospital active medication list Current Facility-Administered Medications   Medication Dose Route Frequency Provider Last Rate Last Dose  . acetaminophen (TYLENOL) tablet 1,000 mg  1,000 mg Oral Q6H Lyndle Herrlich, MD      . amLODipine (NORVASC) tablet 5 mg  5 mg Oral Daily Lyndle Herrlich, MD      . aspirin EC tablet 81 mg  81 mg Oral Daily Lyndle Herrlich, MD      . atorvastatin (LIPITOR) tablet 20 mg  20 mg Oral Daily Lyndle Herrlich, MD      . bisacodyl (DULCOLAX) suppository 10 mg  10 mg Rectal Daily PRN Lyndle Herrlich, MD      . ceFAZolin (ANCEF) IVPB 1 g/50 mL premix  1 g Intravenous Q6H Lyndle Herrlich, MD      . celecoxib (CELEBREX) capsule 200 mg  200 mg Oral Q12H Lyndle Herrlich, MD      . docusate sodium (COLACE) capsule 100 mg  100 mg Oral BID Lyndle Herrlich, MD      . dorzolamide-timolol (COSOPT) 22.3-6.8 MG/ML ophthalmic solution 1 drop  1 drop Both Eyes BID Houston Siren, MD      . HYDROcodone-acetaminophen (NORCO/VICODIN) 5-325 MG per tablet 1-2 tablet  1-2 tablet Oral Q4H PRN Lyndle Herrlich, MD      . lactated ringers infusion   Intravenous Continuous Lyndle Herrlich, MD      . Melene Muller ON  10/29/2016] levothyroxine (SYNTHROID, LEVOTHROID) tablet 75 mcg  75 mcg Oral QAC breakfast Lyndle HerrlichBowers, James R, MD      . metoCLOPramide (REGLAN) tablet 5-10 mg  5-10 mg Oral Q8H PRN Lyndle HerrlichBowers, James R, MD       Or  . metoCLOPramide (REGLAN) injection 5-10 mg  5-10 mg Intravenous Q8H PRN Lyndle HerrlichBowers, James R, MD      . morphine 2 MG/ML injection 1 mg  1 mg Intravenous Q2H PRN Lyndle HerrlichBowers, James R, MD      . ondansetron [facility](ZOFRAN) tablet 4 mg  4 mg Oral Q6H PRN Lyndle HerrlichBowers, James R, MD       Or  . ondansetron [facility](ZOFRAN) injection 4 mg  4 mg Intravenous Q6H PRN Lyndle HerrlichBowers, James R, MD      . polyethylene glycol (MIRALAX / GLYCOLAX) packet 17 g  17 g Oral Daily PRN Lyndle HerrlichBowers, James R, MD      . senna (SENOKOT) tablet 8.6 mg  1 tablet Oral BID Lyndle HerrlichBowers, James R, MD      . sodium phosphate (FLEET) 7-19 GM/118ML enema 1 enema  1 enema Rectal Once PRN Lyndle HerrlichBowers, James R, MD         Discharge  Medications: Please see discharge summary for a list of discharge medications.  Relevant Imaging Results:  Relevant Lab Results:   Additional Information  (SSN: [id number])  Myliyah Rebuck, Darleen CrockerBailey M, LCSW

## 2016-10-28 NOTE — H&P (Signed)
The patient has been re-examined, and the chart reviewed, and there have been no interval changes to the documented history and physical.  Plan a left hip closed reduction and pinning today.  Anesthesia is not consulted regarding a peripheral nerve block for post-operative pain.  The risks, benefits, and alternatives have been discussed at length, and the patient is willing to proceed.

## 2016-10-28 NOTE — Op Note (Signed)
10/27/2016 - 10/28/2016  2:04 PM  PATIENT:  Ashley Cantrell    PRE-OPERATIVE DIAGNOSIS:  left hip fracture  POST-OPERATIVE DIAGNOSIS:  Same  PROCEDURE:  CANNULATED HIP PINNING  SURGEON:  Lyndle HerrlichJames R Scotty Weigelt, MD     ANESTHESIA:   General  PREOPERATIVE INDICATIONS:  [name] is a  83 y.o. female who fell and was found to have a diagnosis of left hip fracture who elected for surgical management.    The risks benefits and alternatives were discussed with the patient preoperatively including but not limited to the risks of infection, bleeding, nerve injury, cardiopulmonary complications, blood clots, malunion, nonunion, avascular necrosis, the need for revision surgery, the potential for conversion to hemiarthroplasty, among others, and the patient was willing to proceed.  OPERATIVE IMPLANTS: 6.5 mm cannulated screws x3  OPERATIVE FINDINGS: Clinical osteoporosis with weak bone, proximal femur  OPERATIVE PROCEDURE: The patient was brought to the operating room and placed in supine position. IV antibiotics were given. General anesthesia administered.  The patient was placed on the fracture table. The operative extremity was positioned, without any significant reduction maneuver and was prepped and draped in usual sterile fashion.  Time out was performed.  Small incision was made distal to the greater trochanter, and 3 guidewires were introduced into the head and neck. The lengths were measured. The reduction was slightly valgus, and near-anatomic. I opened the cortex with a cannulated drill, and then placed the screws into position. Satisfactory fixation was achieved.  The wounds were irrigated copiously, and repaired with 2-0 Vicryl and staples for the skin. A sterile dressing was applied. Sponge and needle count were correct.  There no apparent complications and the patient tolerated the procedure well.  Dola ArgyleJames R. Odis LusterBowers, MD

## 2016-10-28 NOTE — Anesthesia Preprocedure Evaluation (Signed)
Anesthesia Evaluation  Patient identified by MRN, date of birth, ID band Patient awake    Reviewed: Allergy & Precautions, H&P , NPO status , Patient's Chart, lab work & pertinent test results, reviewed documented beta blocker date and time   Airway Mallampati: II  TM Distance: >3 FB Neck ROM: full    Dental  (+) Teeth Intact   Pulmonary neg pulmonary ROS,    Pulmonary exam normal        Cardiovascular negative cardio ROS Normal cardiovascular exam Rhythm:regular Rate:Normal     Neuro/Psych negative neurological ROS  negative psych ROS   GI/Hepatic negative GI ROS, Neg liver ROS,   Endo/Other  negative endocrine ROSdiabetes  Renal/GU negative Renal ROS  negative genitourinary   Musculoskeletal   Abdominal   Peds  Hematology negative hematology ROS (+)   Anesthesia Other Findings Past Medical History: No date: Diabetes mellitus without complication (HCC) No date: Thyroid disease Past Surgical History: No date: CATARACT EXTRACTION BMI    Body Mass Index:  19.79 kg/m     Reproductive/Obstetrics negative OB ROS                             Anesthesia Physical Anesthesia Plan  ASA: II  Anesthesia Plan: Spinal   Post-op Pain Management:    Induction:   PONV Risk Score and Plan: 4 or greater and Ondansetron, Dexamethasone, Midazolam and Propofol infusion  Airway Management Planned:   Additional Equipment:   Intra-op Plan:   Post-operative Plan:   Informed Consent: I have reviewed the patients History and Physical, chart, labs and discussed the procedure including the risks, benefits and alternatives for the proposed anesthesia with the patient or authorized representative who has indicated his/her understanding and acceptance.   Dental Advisory Given  Plan Discussed with: CRNA  Anesthesia Plan Comments:         Anesthesia Quick Evaluation

## 2016-10-28 NOTE — Transfer of Care (Signed)
Immediate Anesthesia Transfer of Care Note  Patient: Ashley Cantrell  Procedure(s) Performed: Procedure(s): CANNULATED HIP PINNING (Left)  Patient Location: PACU  Anesthesia Type:Spinal  Level of Consciousness: awake and alert   Airway & Oxygen Therapy: Patient Spontanous Breathing  Post-op Assessment: Report given to RN and Post -op Vital signs reviewed and stable  Post vital signs: Reviewed  Last Vitals:  Vitals:   10/28/16 1154 10/28/16 1359  BP: (!) 156/69 (!) 101/49  Pulse: 88   Resp: 20 16  Temp: 36.9 C 36.5 C  SpO2: 100% 100%    Last Pain:  Vitals:   10/28/16 1154  TempSrc: Tympanic         Complications: No apparent anesthesia complications

## 2016-10-28 NOTE — Clinical Social Work Note (Signed)
Clinical Social Work Assessment  Patient Details  Name: Ashley Cantrell MRN: [id number] Date of Birth: 05/14/1925  Date of referral:  10/28/16               Reason for consult:  Facility Placement                Permission sought to share information with:  Chartered certified accountant granted to share information::  Yes, Verbal Permission Granted  Name::      [name]::   [place]   Relationship::     Contact Information:     Housing/Transportation Living arrangements for the past 2 months:  [place] of Information:  Patient Patient Interpreter Needed:  None Criminal Activity/Legal Involvement Pertinent to Current Situation/Hospitalization:  No - Comment as needed Significant Relationships:  Adult Children, Other Family Members Lives with:  Adult Children Do you feel safe going back to the place where you live?  Yes Need for family participation in patient care:  Yes (Comment)  Care giving concerns:  Patient lives with her daughter Ashley Cantrell in [place].    Social Worker assessment / plan:  Holiday representative (CSW) reviewed chart and noted that patient will have surgery today for a hip fracture. CSW met with patient and her daughter Ashley Cantrell, cousin Ashley Cantrell [phone] and her pastor were at beside. Patient's daughter Ashley Cantrell is deaf and the pastor used sign language to communicate to the patient's daughter what was going on. CSW introduced self and explained role of CSW department. Patient reported that she lives with her daughter Ashley Cantrell in [place]. CSW explained that PT will work with patient after surgery and make a recommendation of home health or SNF. CSW explained SNF process and that [name] will have to approve SNF. Patient and daughter are agreeable to SNF search in [name] if needed and prefer [name]. Per patient's daughter Ashley Cantrell patient's husband was a resident at [name] however he  has passed away. FL2 complete and faxed out. CSW will continue to follow and assist as needed.   Employment status:  Disabled (Comment on whether or not currently receiving Disability), Retired Nurse, adult PT Recommendations:  Not assessed at this time Information / Referral to community resources:  [place]  Patient/Family's Response to care:  Patient and her family were agreeable to SNF search in [place].   Patient/Family's Understanding of and Emotional Response to Diagnosis, Current Treatment, and Prognosis:  Patient and her family were very pleasant and thanked CSW for assistance.   Emotional Assessment Appearance:  Appears stated age Attitude/Demeanor/Rapport:    Affect (typically observed):  Accepting, Adaptable, Pleasant Orientation:  Oriented to Self, Oriented to Place, Oriented to  Time, Oriented to Situation Alcohol / Substance use:  Not Applicable Psych involvement (Current and /or in the community):  No (Comment)  Discharge Needs  Concerns to be addressed:  Discharge Planning Concerns Readmission within the last 30 days:  No Current discharge risk:  Dependent with Mobility Barriers to Discharge:  Continued Medical Work up   [employer], Veronia Beets, LCSW 10/28/2016, 4:08 PM

## 2016-10-28 NOTE — Clinical Social Work Placement (Signed)
   CLINICAL SOCIAL WORK PLACEMENT  NOTE  Date:  10/28/2016  Patient Details  Name: Ashley Cantrell MRN: [id number] Date of Birth: 12-07-1925  Clinical Social Work is seeking post-discharge placement for this patient at the Skilled  Nursing Facility level of care (*CSW will initial, date and re-position this form in  chart as items are completed):  Yes   Patient/family provided with [facility] Clinical Social Work Department's list of facilities offering this level of care within the geographic area requested by the patient (or if unable, by the patient's family).  Yes   Patient/family informed of their freedom to choose among providers that offer the needed level of care, that participate in Medicare, Medicaid or managed care program needed by the patient, have an available bed and are willing to accept the patient.  Yes   Patient/family informed of [facility]'s ownership interest in [facility] and [facility], as well as of the fact that they are under no obligation to receive care at these facilities.  PASRR submitted to EDS on 10/28/16     PASRR number received on 10/28/16     Existing PASRR number confirmed on       FL2 transmitted to all facilities in geographic area requested by pt/family on 10/28/16     FL2 transmitted to all facilities within larger geographic area on       Patient informed that his/her managed care company has contracts with or will negotiate with certain facilities, including the following:            Patient/family informed of bed offers received.  Patient chooses bed at       Physician recommends and patient chooses bed at      Patient to be transferred to   on  .  Patient to be transferred to facility by       Patient family notified on   of transfer.  Name of family member notified:        PHYSICIAN       Additional Comment:    _______________________________________________ Roshni Burbano, Darleen CrockerBailey M, LCSW 10/28/2016, 4:07 PM

## 2016-10-28 NOTE — Anesthesia Post-op Follow-up Note (Signed)
Anesthesia QCDR form completed.        

## 2016-10-28 NOTE — Consult Note (Signed)
ORTHOPAEDIC CONSULTATION  REQUESTING PHYSICIAN: Houston Siren, MD  Chief Complaint: left hip pain  HPI: Ashley Cantrell is a 83 y.o. female who complains of  Left hip pain after a fall in her kitchen yesterday. Please see chart for details. No numbness, tingling or constitutional symptoms. She is extremely hard of hearing, therefore a notepad is used for communication.  Past Medical History:  Diagnosis Date  . Diabetes mellitus without complication (HCC)   . Thyroid disease    Past Surgical History:  Procedure Laterality Date  . CATARACT EXTRACTION     Social History   Social History  . Marital status: Married    Spouse name: N/A  . Number of children: N/A  . Years of education: N/A   Social History Main Topics  . Smoking status: Never Smoker  . Smokeless tobacco: Never Used  . Alcohol use No  . Drug use: Unknown  . Sexual activity: Not Asked   Other Topics Concern  . None   Social History Narrative   Lives with daughter who is deaf.   Family History  Problem Relation Age of Onset  . Breast cancer Neg Hx    No Known Allergies Prior to Admission medications   Medication Sig Start Date End Date Taking? Authorizing Provider  ALPRAZolam (XANAX) 0.25 MG tablet Take 0.25 mg by mouth daily as needed. 10/04/16 11/03/16 Yes [provider]  amLODipine (NORVASC) 5 MG tablet Take 5 mg by mouth daily.   Yes [provider]  aspirin EC 81 MG tablet Take 81 mg by mouth daily.   Yes [provider]  atorvastatin (LIPITOR) 20 MG tablet Take 20 mg by mouth daily.   Yes [provider]  dorzolamide-timolol (COSOPT) 22.3-6.8 MG/ML ophthalmic solution Place 1 drop into both eyes 2 (two) times daily.   Yes [provider]  levothyroxine (SYNTHROID, LEVOTHROID) 75 MCG tablet Take 75 mcg by mouth daily before breakfast.   Yes [provider]  trimethoprim-polymyxin b (POLYTRIM) ophthalmic solution Place 2 drops into the right eye  every 6 (six) hours. Patient not taking: Reported on 10/27/2016 01/18/15   Cuthriell, Delorise Royals, PA-C   Dg Chest Portable 1 View  Result Date: 10/27/2016 CLINICAL DATA:  Preop, hip fracture EXAM: PORTABLE CHEST 1 VIEW COMPARISON:  10/27/2016 FINDINGS: Lungs demonstrate no consolidation or effusion. Heart size within normal limits. Aortic atherosclerosis. No pneumothorax. IMPRESSION: No active disease. Electronically Signed   By: Jasmine Pang M.D.   On: 10/27/2016 20:01   Dg Hip Unilat W Or Wo Pelvis 2-3 Views Left  Result Date: 10/27/2016 CLINICAL DATA:  Trip and fall with left hip pain. EXAM: DG HIP (WITH OR WITHOUT PELVIS) 2-3V LEFT COMPARISON:  None. FINDINGS: Impacted fracture of the left femoral neck with minimal displacement. No significant angulation. New migration of the femoral shaft. The remainder the bony pelvis is intact without additional acute fracture. Age related degenerative change of both hips and sacroiliac joints. IMPRESSION: Impacted left femoral neck fracture with minimal displacement. Electronically Signed   By: Rubye Oaks M.D.   On: 10/27/2016 19:27    Positive ROS: All other systems have been reviewed and were otherwise negative with the exception of those mentioned in the HPI and as above.  Physical Exam: General: Alert, no acute distress Cardiovascular: No pedal edema Respiratory: No cyanosis, no use of accessory musculature GI: No organomegaly, abdomen is soft and non-tender Skin: No lesions in the area of chief complaint Neurologic: Sensation intact distally Psychiatric:  Patient is competent for consent with normal mood and affect Lymphatic: No axillary or cervical lymphadenopathy  MUSCULOSKELETAL: RLE FROM and non-tender, BUE FROM, non-tender LLE: pain with IR/ER, +EHL/PF/DF, sensation to light touch intact  Assessment: Closed, displaced fracture of the left femoral neck  Plan:  The diagnosis, risks, benefits and alternatives to treatment are all  discussed in detail with the patient and family. Risks include but are not limited to bleeding, infection, DVT, PE, nerve or vascular injury, non-union, repeat operation, persistent pain, weakness, stiffness and death. She understands and is eager to proceed.  Plan a closed reduction with percutaneous pinning of the left hip.     Lyndle HerrlichJames R Jamae Tison, MD    10/28/2016 8:04 AM

## 2016-10-28 NOTE — Progress Notes (Signed)
New Admission Note:   Arrival Method: per stretcher from ED, pt came from a friend's home where she fell Mental Orientation: alert and oriented X4, forgetful but can be redirected, extremely hard of hearing, with hearing aid on the right ear Telemetry: none ordered Assessment: Completed Skin: warm, dry, with small abrasion on the left elbow sustained from the fall, prophylactic sacral foam dressing applied, spider web-like veins on both ankles and feet noted. IV: G22 on the left forearm with transparent dressing, intact Pain: denies having pain as of this time. Pt stated pain is "not bad" Safety Measures: Safety Fall Prevention Plan has been given and discussed Admission: Completed 1A Orientation: Patient has been oriented to the room, unit and staff.  Family: daughter and friend at bedside  Orders have been reviewed and implemented. Call light has been placed within reach, yellow socks on, fall risk armband applied and bed alarm activated. Will continue to monitor patient.   Janice NorrieAnessa Boots Mcglown, RN [facility] 1A

## 2016-10-28 NOTE — Anesthesia Procedure Notes (Signed)
Spinal  Patient location during procedure: OR Staffing Anesthesiologist: Molli Barrows Resident/CRNA: Rolla Plate Performed: resident/CRNA  Preanesthetic Checklist Completed: patient identified, site marked, surgical consent, pre-op evaluation, timeout performed, IV checked, risks and benefits discussed and monitors and equipment checked Spinal Block Patient position: right lateral decubitus Prep: ChloraPrep and site prepped and draped Patient monitoring: heart rate, continuous pulse ox, blood pressure and cardiac monitor Approach: midline Location: L4-5 Injection technique: single-shot Needle Needle type: Introducer and Pencan  Needle gauge: 24 G Needle length: 9 cm Additional Notes Negative paresthesia. Negative blood return. Positive free-flowing CSF. Expiration date of kit checked and confirmed. Patient tolerated procedure well, without complications.

## 2016-10-28 NOTE — Progress Notes (Signed)
[employer] - [place] [facility]   PATIENT NAME: Ashley Cantrell    MR#:  [id number]  DATE OF BIRTH:  29-Mar-1925  SUBJECTIVE:   Pt. Is here s/p fall and noted to have Left hip Fracture.  S/p Left hip ORIF and percutaneous pinning today. Patient is very hard of hearing. Denies any complaints presently.  REVIEW OF SYSTEMS:    Review of Systems  Constitutional: Negative for chills and fever.  HENT: Negative for congestion and tinnitus.   Eyes: Negative for blurred vision and double vision.  Respiratory: Negative for cough, shortness of breath and wheezing.   Cardiovascular: Negative for chest pain, orthopnea and PND.  Gastrointestinal: Negative for abdominal pain, diarrhea, nausea and vomiting.  Genitourinary: Negative for dysuria and hematuria.  Neurological: Negative for dizziness, sensory change and focal weakness.  All other systems reviewed and are negative.   Nutrition: Heart Healthy Tolerating Diet: Yes Tolerating PT: Await Eval Post- op.   DRUG ALLERGIES:  No Known Allergies  VITALS:  Blood pressure 132/65, pulse 70, temperature 99.2 F (37.3 C), resp. rate 14, height 5\' 5"  (1.651 m), weight 53.9 kg (118 lb 14.4 oz), SpO2 100 %.  PHYSICAL EXAMINATION:   Physical Exam  GENERAL:  83 y.o.-year-old patient lying in bed in no acute distress.  EYES: Pupils equal, round, reactive to light and accommodation. No scleral icterus. Extraocular muscles intact.  HEENT: Head atraumatic, normocephalic. Oropharynx and nasopharynx clear.  NECK:  Supple, no jugular venous distention. No thyroid enlargement, no tenderness.  LUNGS: Normal breath sounds bilaterally, no wheezing, rales, rhonchi. No use of accessory muscles of respiration.  CARDIOVASCULAR: S1, S2 normal. No murmurs, rubs, or gallops.  ABDOMEN: Soft, nontender, nondistended. Bowel sounds present. No organomegaly or mass.  EXTREMITIES: No cyanosis, clubbing or edema b/l.    NEUROLOGIC: Cranial nerves II  through XII are intact. No focal Motor or sensory deficits b/l.   PSYCHIATRIC: The patient is alert and oriented x 3.  SKIN: No obvious rash, lesion, or ulcer.    LABORATORY PANEL:   CBC  Recent Labs Lab 10/28/16 0436  WBC 7.1  HGB 12.6  HCT 36.1  PLT 256   ------------------------------------------------------------------------------------------------------------------  Chemistries   Recent Labs Lab 10/28/16 0436  NA 136  K 3.5  CL 105  CO2 25  GLUCOSE 153*  BUN 16  CREATININE 0.85  CALCIUM 8.9   ------------------------------------------------------------------------------------------------------------------  Cardiac Enzymes No results for input(s): TROPONINI in the last 168 hours. ------------------------------------------------------------------------------------------------------------------  RADIOLOGY:  Dg Chest Portable 1 View  Result Date: 10/27/2016 CLINICAL DATA:  Preop, hip fracture EXAM: PORTABLE CHEST 1 VIEW COMPARISON:  10/27/2016 FINDINGS: Lungs demonstrate no consolidation or effusion. Heart size within normal limits. Aortic atherosclerosis. No pneumothorax. IMPRESSION: No active disease. Electronically Signed   By: Jasmine PangKim  Fujinaga M.D.   On: 10/27/2016 20:01   Dg Hip Operative Unilat W Or W/o Pelvis Left  Result Date: 10/28/2016 CLINICAL DATA:  Surgical fixation for proximal femur fracture EXAM: OPERATIVE LEFT HIP   2 VIEWS TECHNIQUE: Fluoroscopic spot image(s) were submitted for interpretation post-operatively. FLUOROSCOPY TIME:  0 MINUTES 35 SECONDS; 2 ACQUIRED IMAGES COMPARISON:  Pelvis and left hip radiographs October 27, 2016 FINDINGS: Frontal lateral views were obtained. There are 3 screws transfixing a subcapital femoral neck fracture on the left. Alignment at the fracture site is essentially anatomic. Tips of screws in proximal femoral head. No new fracture. No dislocation. Left hip joint space appears unremarkable. IMPRESSION: Fixation of  subcapital femoral neck fracture on  the left with alignment essentially anatomic. Screw tips in proximal femoral head. No new fracture. No dislocation. Electronically Signed   By: Bretta Bang III M.D.   On: 10/28/2016 13:51   Dg Hip Unilat W Or Wo Pelvis 2-3 Views Left  Result Date: 10/27/2016 CLINICAL DATA:  Trip and fall with left hip pain. EXAM: DG HIP (WITH OR WITHOUT PELVIS) 2-3V LEFT COMPARISON:  None. FINDINGS: Impacted fracture of the left femoral neck with minimal displacement. No significant angulation. New migration of the femoral shaft. The remainder the bony pelvis is intact without additional acute fracture. Age related degenerative change of both hips and sacroiliac joints. IMPRESSION: Impacted left femoral neck fracture with minimal displacement. Electronically Signed   By: Rubye Oaks M.D.   On: 10/27/2016 19:27     ASSESSMENT AND PLAN:   83 year old female with past medical history of diabetes, hypothyroidism,who presented to the hospital after a mechanical fall and noted to have a left hip fracture.  1. Status post fall and left hip fracture-seen by orthopedics and status post ORIF and percutaneous pinning today. -Continue pain control and further care as per orthopedics.  2. Hypothyroidism-continue Synthroid.  3. Diabetes type 2 without complication-continue sliding scale insulin.  4. Glaucoma-continue dorzolamide, timolol eyedrops  5. Anxiety-continue Xanax as needed.  6. Essential hypertension-continue Norvasc.  7. Hyperlipidemia-continue atorvastatin.     All the records are reviewed and case discussed with Care Management/Social Worker. Management plans discussed with the patient, family and they are in agreement.  CODE STATUS: Full code  DVT Prophylaxis: Hep. SQ  TOTAL TIME TAKING CARE OF THIS PATIENT: 30 minutes.   POSSIBLE D/C IN 1-2 DAYS, DEPENDING ON CLINICAL CONDITION.   Houston Siren M.D on 10/28/2016 at 3:08 PM  Between 7am to 6pm  - Pager - [phone]  After 6pm go to [email or web address] - Therapist, nutritional Hospitalists  Office  [phone]  CC: Primary care physician; Raynelle Bring

## 2016-10-28 NOTE — Progress Notes (Signed)
[facility]. NS stopped prior to her leaving and she was placed on saline lock. Daughter, niece, and friend accompanied her to surgery to talk with anesthesia due to her being hard of hearing.

## 2016-10-29 ENCOUNTER — Encounter: Payer: Self-pay | Admitting: Orthopedic Surgery

## 2016-10-29 LAB — COMPREHENSIVE METABOLIC PANEL
ALBUMIN: 3.2 g/dL — AB (ref 3.5–5.0)
ALT: 13 U/L — ABNORMAL LOW (ref 14–54)
ANION GAP: 6 (ref 5–15)
AST: 26 U/L (ref 15–41)
Alkaline Phosphatase: 47 U/L (ref 38–126)
BUN: 11 mg/dL (ref 6–20)
CO2: 27 mmol/L (ref 22–32)
Calcium: 8.6 mg/dL — ABNORMAL LOW (ref 8.9–10.3)
Chloride: 108 mmol/L (ref 101–111)
Creatinine, Ser: 0.74 mg/dL (ref 0.44–1.00)
GFR calc Af Amer: >60 mL/min (ref >60)
GFR calc non Af Amer: >60 mL/min (ref >60)
GLUCOSE: 93 mg/dL (ref 65–99)
POTASSIUM: 3.6 mmol/L (ref 3.5–5.1)
SODIUM: 141 mmol/L (ref 135–145)
Total Bilirubin: 1.3 mg/dL — ABNORMAL HIGH (ref 0.3–1.2)
Total Protein: 6.1 g/dL — ABNORMAL LOW (ref 6.5–8.1)

## 2016-10-29 LAB — GLUCOSE, CAPILLARY: Glucose-Capillary: 82 mg/dL (ref 65–99)

## 2016-10-29 NOTE — Progress Notes (Signed)
Physical Therapy Treatment Patient Details Name: Myrtha MantisMae L Timbrook MRN: [id number] DOB: 06/26/1925 Today's Date: 10/29/2016    History of Present Illness presented to ER secondary to fall in kitchen with acute onset of L hip pain; admitted secondary to displaced L femoral neck fracture, status post cannulated hip pinning (10/28/16).    PT Comments    Progressive increase in gait distance, able to complete full lap around nursing station without difficulty.  Good WBing/stance time L LE with minimal reports of pain or discomfort.  Slow and cautious gait performance (10' walk time, 14-15 seconds), though no buckling or LOB noted during trial.  Family (daughter-deaf, and niece-hearing) in room for PM session.  Able to observe treatment session; very supportive/encouraging to patient.  Niece confirms patient lives with daughter in single-story home with ramp access (stairs not required).  Ambulatory without assist device at baseline.    Follow Up Recommendations  Home health PT     Equipment Recommendations  Rolling walker with 5" wheels;3in1 (PT)    Recommendations for Other Services       Precautions / Restrictions Precautions Precautions: Fall Restrictions Weight Bearing Restrictions: Yes LLE Weight Bearing: Weight bearing as tolerated    Mobility  Bed Mobility Overal bed mobility: Modified Independent                Transfers Overall transfer level: Needs assistance Equipment used: Rolling walker (2 wheeled) Transfers: Sit to/from Stand Sit to Stand: Supervision         General transfer comment: improving use of L LE as soreness improves  Ambulation/Gait Ambulation/Gait assistance: Min guard Ambulation Distance (Feet): 200 Feet Assistive device: Rolling walker (2 wheeled)   Gait velocity: 10' walk time, 14-15 seconds   General Gait Details: progressing towards reciprocal stepping pattern, though cadence/overall gait speed remains very slow and guarded.  Good  weight acceptance/stance time L LE with good control/stability noted.   Stairs            Wheelchair Mobility    Modified Rankin (Stroke Patients Only)       Balance Overall balance assessment: Needs assistance Sitting-balance support: No upper extremity supported;Feet supported Sitting balance-Leahy Scale: Good     Standing balance support: Bilateral upper extremity supported Standing balance-Leahy Scale: Fair                              Cognition Arousal/Alertness: Awake/alert Behavior During Therapy: WFL for tasks assessed/performed Overall Cognitive Status: Within Functional Limits for tasks assessed                                        Exercises Other Exercises Other Exercises: Toilet transfer, ambulatory with RW, cga.  Sit/stand from standard height toilet with RW, cga/close sup.  Static standing balance with RW for clothing management, hand hygiene, cga/close sup.  Intermittent cuing for walker management; tends to step away from when approaching seating surface or sink.  Good LE strenght/power; minimal reports of pain.    General Comments        Pertinent Vitals/Pain Pain Assessment: Faces Pain Score: 2  Faces Pain Scale: Hurts a little bit Pain Location: Left LE Pain Descriptors / Indicators: Aching;Grimacing Pain Intervention(s): Limited activity within patient's tolerance;Monitored during session;Repositioned    Home Living Family/patient expects to be discharged to:: Private residence Living Arrangements: Children Available Help at  Discharge: Available 24 hours/day Type of Home: House Home Access: Stairs to enter   Home Layout: One level Home Equipment: None Additional Comments: Patient is very HOH.    Prior Function Level of Independence: Independent      Comments: Independent with ADLS. Resides with her daughter   PT Goals (current goals can now be found in the care plan section) Acute Rehab PT  Goals Patient Stated Goal: to get up and move around for the first time PT Goal Formulation: With patient Time For Goal Achievement: 11/12/16 Potential to Achieve Goals: Good Progress towards PT goals: Progressing toward goals    Frequency    BID      PT Plan Current plan remains appropriate    Co-evaluation              AM-PAC PT "6 Clicks" Daily Activity  Outcome Measure  Difficulty turning over in bed (including adjusting bedclothes, sheets and blankets)?: None Difficulty moving from lying on back to sitting on the side of the bed? : None Difficulty sitting down on and standing up from a chair with arms (e.g., wheelchair, bedside commode, etc,.)?: Unable Help needed moving to and from a bed to chair (including a wheelchair)?: A Little Help needed walking in hospital room?: A Little Help needed climbing 3-5 steps with a railing? : A Little 6 Click Score: 18    End of Session Equipment Utilized During Treatment: Gait belt Activity Tolerance: Patient tolerated treatment well Patient left: in bed;with call bell/phone within reach;with bed alarm set Nurse Communication: Mobility status PT Visit Diagnosis: Muscle weakness (generalized) (M62.81);Difficulty in walking, not elsewhere classified (R26.2);Pain Pain - Right/Left: Left Pain - part of body: Hip     Time: 0981-1914 PT Time Calculation (min) (ACUTE ONLY): 17 min  Charges:  $Gait Training: 8-22 mins                    G Codes:       Pamela Intrieri H. Manson Passey, PT, DPT, NCS 10/29/16, 3:52 PM [phone]

## 2016-10-29 NOTE — Evaluation (Signed)
Physical Therapy Evaluation Patient Details Name: Ashley Cantrell MRN: [id number] DOB: January 08, 1926 Today's Date: 10/29/2016   History of Present Illness  presented to ER secondary to fall in kitchen with acute onset of L hip pain; admitted secondary to displaced L femoral neck fracture, status post cannulated hip pinning (10/28/16).  Clinical Impression  Upon evaluation, patient alert and oriented; follows simple commands, but EXTREMELY hard of hearing (often requiring task demonstration or written communication from therapist to fully comprehend task at hand).  Good post-op strength and ROM to L LE, minimal reports of pain noted throughout session.  Demonstrates ability to complete bed mobility with mod indep; sit/stand, basic transfers and gait (50') with RW, cga/close sup.  3-point, step to gait pattern with fair stance time/weight acceptance to L LE; no overt buckling or LOB.  Excellent effort and participation with all therapeutic tasks; anticipate good progression towards all mobility goals. Would benefit from skilled PT to address above deficits and promote optimal return to PLOF; Recommend transition to HHPT upon discharge from acute hospitalization.     Follow Up Recommendations Home health PT ([facility], [facility])    Equipment Recommendations  Rolling walker with 5" wheels;3in1 (PT)    Recommendations for Other Services       Precautions / Restrictions Precautions Precautions: Fall Restrictions Weight Bearing Restrictions: Yes LLE Weight Bearing: Weight bearing as tolerated      Mobility  Bed Mobility Overal bed mobility: Modified Independent                Transfers Overall transfer level: Needs assistance Equipment used: Rolling walker (2 wheeled) Transfers: Sit to/from Stand Sit to Stand: Supervision         General transfer comment: decreased active use of L LE with movement transition; cuing for hand placement (requiring hand-over-hand due to  [facility])  Ambulation/Gait Ambulation/Gait assistance: Min guard Ambulation Distance (Feet): 50 Feet Assistive device: Rolling walker (2 wheeled)       General Gait Details: 3-point, step to gait pattern with decreased stance time L LE. Slow and cautious, but no overt buckling or LOB.  Stairs            Wheelchair Mobility    Modified Rankin (Stroke Patients Only)       Balance Overall balance assessment: Needs assistance Sitting-balance support: No upper extremity supported;Feet supported Sitting balance-Leahy Scale: Good     Standing balance support: Bilateral upper extremity supported Standing balance-Leahy Scale: Fair                               Pertinent Vitals/Pain Pain Assessment: Faces Faces Pain Scale: Hurts a little bit Pain Location: L LE Pain Descriptors / Indicators: Aching;Grimacing Pain Intervention(s): Limited activity within patient's tolerance;Monitored during session;Repositioned    Home Living Family/patient expects to be discharged to:: Private residence Living Arrangements: Children Available Help at Discharge: Family;Available 24 hours/day (per CSW, daughter on disability, available for 24/7 supervision/assist upon discharge.  Of note, daughter fully deaf, uses ASL to communciate, per chart) Type of Home: House Home Access: Stairs to enter   [employer] of Steps: 3 Home Layout: One level Home Equipment: None Additional Comments: Patient extremely HOH; will verify social history with family as available.    Prior Function Level of Independence: Independent         Comments: Indep with ADLs, household activities.     Hand Dominance   Dominant Hand: Right  Extremity/Trunk Assessment   Upper Extremity Assessment Upper Extremity Assessment: Overall WFL for tasks assessed    Lower Extremity Assessment Lower Extremity Assessment: Generalized weakness (L hip grossly 3-/5, guarded due to post-op pain;  otherwise, grossly WFL)       Communication   Communication: HOH  Cognition Arousal/Alertness: Awake/alert Behavior During Therapy: WFL for tasks assessed/performed Overall Cognitive Status: Within Functional Limits for tasks assessed                                        General Comments      Exercises Other Exercises Other Exercises: Supine L LE therex, 1x5, AROM for muscular strength/endurance. Minimal reports of pain with active movement.  Significant difficulty with task comprehension due to profound hearing deficits.   Assessment/Plan    PT Assessment Patient needs continued PT services  PT Problem List Decreased range of motion;Decreased strength;Decreased mobility;Decreased coordination;Decreased activity tolerance;Decreased cognition;Decreased knowledge of use of DME;Decreased balance;Decreased safety awareness;Decreased knowledge of precautions;Pain;Decreased skin integrity       PT Treatment Interventions DME instruction;Gait training;Stair training;Functional mobility training;Therapeutic activities;Therapeutic exercise;Balance training;Patient/family education    PT Goals (Current goals can be found in the Care Plan section)  Acute Rehab PT Goals Patient Stated Goal: to get up and move around for the first time PT Goal Formulation: With patient Time For Goal Achievement: 11/12/16 Potential to Achieve Goals: Good    Frequency BID   Barriers to discharge        Co-evaluation               AM-PAC PT "6 Clicks" Daily Activity  Outcome Measure Difficulty turning over in bed (including adjusting bedclothes, sheets and blankets)?: None Difficulty moving from lying on back to sitting on the side of the bed? : None Difficulty sitting down on and standing up from a chair with arms (e.g., wheelchair, bedside commode, etc,.)?: Unable Help needed moving to and from a bed to chair (including a wheelchair)?: A Little Help needed walking in  hospital room?: A Little Help needed climbing 3-5 steps with a railing? : A Little 6 Click Score: 18    End of Session Equipment Utilized During Treatment: Gait belt Activity Tolerance: Patient tolerated treatment well Patient left: in chair;with call bell/phone within reach;with chair alarm set Nurse Communication: Mobility status PT Visit Diagnosis: Muscle weakness (generalized) (M62.81);Difficulty in walking, not elsewhere classified (R26.2);Pain Pain - Right/Left: Left Pain - part of body: Hip    Time: [id number] PT Time Calculation (min) (ACUTE ONLY): 18 min   Charges:   PT Evaluation $PT Eval Moderate Complexity: 1 Mod PT Treatments $Therapeutic Exercise: 8-22 mins   PT G Codes:   PT G-Codes **NOT FOR INPATIENT CLASS** Functional Assessment Tool Used: AM-PAC 6 Clicks Basic Mobility Functional Limitation: Mobility: Walking and moving around Mobility: Walking and Moving Around Current Status ([id number]): At least 20 percent but less than 40 percent impaired, limited or restricted Mobility: Walking and Moving Around Goal Status [phone](G8979): At least 1 percent but less than 20 percent impaired, limited or restricted     Debany Vantol H. Manson PasseyBrown, PT, DPT, NCS 10/29/16, 11:13 AM [phone]

## 2016-10-29 NOTE — Anesthesia Postprocedure Evaluation (Signed)
Anesthesia Post Note  Patient: Ashley Cantrell  Procedure(s) Performed: Procedure(s) (LRB): CANNULATED HIP PINNING (Left)  Patient location during evaluation: Nursing Unit Anesthesia Type: Spinal Level of consciousness: awake Pain management: pain level controlled Vital Signs Assessment: post-procedure vital signs reviewed and stable Respiratory status: spontaneous breathing Cardiovascular status: blood pressure returned to baseline Postop Assessment: no headache Anesthetic complications: no Comments: Moves feet and legs, catheter remains.     Last Vitals:  Vitals:   10/29/16 0425 10/29/16 0731  BP: (!) 140/49 (!) 142/53  Pulse: 65 64  Resp: 19 17  Temp: 37.1 C 36.7 C  SpO2: 100% 99%    Last Pain:  Vitals:   10/29/16 0731  TempSrc: Oral  PainSc:                  Vernie MurdersPope,  Bintou Lafata G

## 2016-10-29 NOTE — Progress Notes (Signed)
PT is recommending home health. RN case manager aware of above. Please reconsult if future social work needs arise. CSW signing off.   Jep Dyas, LCSW [phone]  

## 2016-10-29 NOTE — Care Management (Signed)
Spoke with niece, Duwayne HeckDanielle. She and Angelique BlonderDenise, daughter will be coming to the hospital. RNCM to meet with family at that time.

## 2016-10-29 NOTE — Care Management Important Message (Signed)
Important Message  Patient Details  Name: Ashley Cantrell MRN: [id number] Date of Birth: 11-12-1925   Medicare Important Message Given:  Yes    [name], RN 10/29/2016, 2:31 PM

## 2016-10-29 NOTE — Progress Notes (Signed)
[employer] [facility] [place] [facility]   PATIENT NAME: Ashley Cantrell    MR#:  [id number]  DATE OF BIRTH:  01/16/1926  SUBJECTIVE:   Pt. Is here s/p fall and noted to have Left hip Fracture.  S/p Left hip ORIF and percutaneous pinning POD # 1.  Pt. Denies any pain.  No acute events overnight.  Patient is very hard of hearing.   REVIEW OF SYSTEMS:    Review of Systems  Constitutional: Negative for chills and fever.  HENT: Negative for congestion and tinnitus.   Eyes: Negative for blurred vision and double vision.  Respiratory: Negative for cough, shortness of breath and wheezing.   Cardiovascular: Negative for chest pain, orthopnea and PND.  Gastrointestinal: Negative for abdominal pain, diarrhea, nausea and vomiting.  Genitourinary: Negative for dysuria and hematuria.  Neurological: Negative for dizziness, sensory change and focal weakness.  All other systems reviewed and are negative.   Nutrition: Heart Healthy Tolerating Diet: Yes Tolerating PT: Await Eval Post- op.   DRUG ALLERGIES:  No Known Allergies  VITALS:  Blood pressure (!) 150/54, pulse 74, temperature 98.1 F (36.7 C), temperature source Oral, resp. rate 17, height 5\' 5"  (1.651 m), weight 53.9 kg (118 lb 14.4 oz), SpO2 100 %.  PHYSICAL EXAMINATION:   Physical Exam  GENERAL:  83 y.o.-year-old patient lying in bed in no acute distress.  EYES: Pupils equal, round, reactive to light and accommodation. No scleral icterus. Extraocular muscles intact.  HEENT: Head atraumatic, normocephalic. Oropharynx and nasopharynx clear.  NECK:  Supple, no jugular venous distention. No thyroid enlargement, no tenderness.  LUNGS: Normal breath sounds bilaterally, no wheezing, rales, rhonchi. No use of accessory muscles of respiration.  CARDIOVASCULAR: S1, S2 normal. No murmurs, rubs, or gallops.  ABDOMEN: Soft, nontender, nondistended. Bowel sounds present. No organomegaly or mass.  EXTREMITIES: No cyanosis, clubbing  or edema b/l.  Left hip dressing from recent Hip surgery.   NEUROLOGIC: Cranial nerves II through XII are intact. No focal Motor or sensory deficits b/l.   PSYCHIATRIC: The patient is alert and oriented x 3.  SKIN: No obvious rash, lesion, or ulcer.    LABORATORY PANEL:   CBC  Recent Labs Lab 10/28/16 0436  WBC 7.1  HGB 12.6  HCT 36.1  PLT 256   ------------------------------------------------------------------------------------------------------------------  Chemistries   Recent Labs Lab 10/29/16 0439  NA 141  K 3.6  CL 108  CO2 27  GLUCOSE 93  BUN 11  CREATININE 0.74  CALCIUM 8.6*  AST 26  ALT 13*  ALKPHOS 47  BILITOT 1.3*   ------------------------------------------------------------------------------------------------------------------  Cardiac Enzymes No results for input(s): TROPONINI in the last 168 hours. ------------------------------------------------------------------------------------------------------------------  RADIOLOGY:  Dg Chest Portable 1 View  Result Date: 10/27/2016 CLINICAL DATA:  Preop, hip fracture EXAM: PORTABLE CHEST 1 VIEW COMPARISON:  10/27/2016 FINDINGS: Lungs demonstrate no consolidation or effusion. Heart size within normal limits. Aortic atherosclerosis. No pneumothorax. IMPRESSION: No active disease. Electronically Signed   By: Jasmine PangKim  Fujinaga M.D.   On: 10/27/2016 20:01   Dg Hip Operative Unilat W Or W/o Pelvis Left  Result Date: 10/28/2016 CLINICAL DATA:  Surgical fixation for proximal femur fracture EXAM: OPERATIVE LEFT HIP   2 VIEWS TECHNIQUE: Fluoroscopic spot image(s) were submitted for interpretation post-operatively. FLUOROSCOPY TIME:  0 MINUTES 35 SECONDS; 2 ACQUIRED IMAGES COMPARISON:  Pelvis and left hip radiographs October 27, 2016 FINDINGS: Frontal lateral views were obtained. There are 3 screws transfixing a subcapital femoral neck fracture on the left. Alignment at  the fracture site is essentially anatomic. Tips of  screws in proximal femoral head. No new fracture. No dislocation. Left hip joint space appears unremarkable. IMPRESSION: Fixation of subcapital femoral neck fracture on the left with alignment essentially anatomic. Screw tips in proximal femoral head. No new fracture. No dislocation. Electronically Signed   By: Bretta Bang III M.D.   On: 10/28/2016 13:51   Dg Hip Unilat W Or Wo Pelvis 2-3 Views Left  Result Date: 10/27/2016 CLINICAL DATA:  Trip and fall with left hip pain. EXAM: DG HIP (WITH OR WITHOUT PELVIS) 2-3V LEFT COMPARISON:  None. FINDINGS: Impacted fracture of the left femoral neck with minimal displacement. No significant angulation. New migration of the femoral shaft. The remainder the bony pelvis is intact without additional acute fracture. Age related degenerative change of both hips and sacroiliac joints. IMPRESSION: Impacted left femoral neck fracture with minimal displacement. Electronically Signed   By: Rubye Oaks M.D.   On: 10/27/2016 19:27     ASSESSMENT AND PLAN:   83 year old female with past medical history of diabetes, hypothyroidism,who presented to the hospital after a mechanical fall and noted to have a left hip fracture.  1. Status post fall and left hip fracture-seen by orthopedics and s/p ORIF and percutaneous pinning POD # 1.  -Continue pain control and further care as per orthopedics. - seen by PT and they recommend Home Health PT  2. Hypothyroidism-continue Synthroid.  3. Diabetes type 2 without complication-continue sliding scale insulin. - BS stable.   4. Glaucoma-continue dorzolamide, timolol eyedrops  5. Anxiety-continue Xanax as needed.  6. Essential hypertension-continue Norvasc.  7. Hyperlipidemia-continue atorvastatin.  Possible d/c in next 24 hrs.   All the records are reviewed and case discussed with Care Management/Social Worker. Management plans discussed with the patient, family and they are in agreement.  CODE STATUS: Full  code  DVT Prophylaxis: Hep. SQ  TOTAL TIME TAKING CARE OF THIS PATIENT: 25 minutes.   POSSIBLE D/C IN 1-2 DAYS, DEPENDING ON CLINICAL CONDITION.   Houston Siren M.D on 10/29/2016 at 1:15 PM  Between 7am to 6pm - Pager - [phone]  After 6pm go to [email or web address] - Therapist, nutritional Hospitalists  Office  [phone]  CC: Primary care physician; Raynelle Bring

## 2016-10-29 NOTE — Care Management Note (Signed)
Case Management Note  Patient Details  Name: ARSHIA SPELLMAN MRN: [id number] Date of Birth: [birth year]  Subjective/Objective:  Met with patient, daughter, brother and niece ([phone]  ) at bedside.Discussed discharge planning home with home health and Andee Poles is in agreement. She will be the primary contact. Daughter is deaf, patient extremely hard of hearing. Offered choice of home health agencies with referral to [facility] for [place] [place]. Family refused a HHA. It is anticipated that patient will discharge home tomorrow.Patient will need a walker and bsc. Ordered from [facility].                   Action/Plan:   Expected Discharge Date:                  Expected Discharge Plan:  [place]  In-House Referral:     Discharge planning Services  CM Consult  Post Acute Care Choice:  Home Health Choice offered to:   (Danille, neice)  DME Arranged: walker and bsc   DME Agency:   [facility]  HH Arranged:  OT, PT [place] Agency:  [place]  Status of Service:  In process, will continue to follow  If discussed at Long Length of Stay Meetings, dates discussed:    Additional Comments:  Jolly Mango, RN 10/29/2016, 2:12 PM

## 2016-10-29 NOTE — Progress Notes (Signed)
Subjective:  Patient reports pain as mild.  No other complaints.  Objective:   VITALS:   Vitals:   10/28/16 2027 10/29/16 0007 10/29/16 0425 10/29/16 0731  BP: (!) 115/54 (!) 149/64 (!) 140/49 (!) 142/53  Pulse: 88 82 65 64  Resp: Temp: 99.4 F (37.4 C) 98.6 F (37 C) 98.7 F (37.1 C) 98.1 F (36.7 C)  TempSrc: Oral Oral Oral Oral  SpO2: 100% 96% 100% 99%  Weight:      Height:        PHYSICAL EXAM:  Sensation intact distally Dorsiflexion/Plantar flexion intact Incision: dressing C/D/I  LABS  Results for orders placed or performed during the hospital encounter of 10/27/16 (from the past 24 hour(s))  MRSA PCR Screening     Status: None   Collection Time: 10/28/16 10:05 AM  Result Value Ref Range   MRSA by PCR NEGATIVE NEGATIVE  Glucose, capillary     Status: None   Collection Time: 10/28/16 12:00 PM  Result Value Ref Range   Glucose-Capillary 96 65 - 99 mg/dL  Glucose, capillary     Status: None   Collection Time: 10/28/16  2:10 PM  Result Value Ref Range   Glucose-Capillary 93 65 - 99 mg/dL  Glucose, capillary     Status: None   Collection Time: 10/28/16  4:53 PM  Result Value Ref Range   Glucose-Capillary 90 65 - 99 mg/dL  Comprehensive metabolic panel     Status: Abnormal   Collection Time: 10/29/16  4:39 AM  Result Value Ref Range   Sodium 141 135 - 145 mmol/L   Potassium 3.6 3.5 - 5.1 mmol/L   Chloride 108 101 - 111 mmol/L   CO2 27 22 - 32 mmol/L   Glucose, Bld 93 65 - 99 mg/dL   BUN 11 6 - 20 mg/dL   Creatinine, Ser 6.04 0.44 - 1.00 mg/dL   Calcium 8.6 (L) 8.9 - 10.3 mg/dL   Total Protein 6.1 (L) 6.5 - 8.1 g/dL   Albumin 3.2 (L) 3.5 - 5.0 g/dL   AST 26 15 - 41 U/L   ALT 13 (L) 14 - 54 U/L   Alkaline Phosphatase 47 38 - 126 U/L   Total Bilirubin 1.3 (H) 0.3 - 1.2 mg/dL   GFR calc non Af Amer >60 >60 mL/min   GFR calc Af Amer >60 >60 mL/min   Anion gap 6 5 - 15  Glucose, capillary     Status: None   Collection Time: 10/29/16  7:42  AM  Result Value Ref Range   Glucose-Capillary 82 65 - 99 mg/dL    Dg Chest Portable 1 View  Result Date: 10/27/2016 CLINICAL DATA:  Preop, hip fracture EXAM: PORTABLE CHEST 1 VIEW COMPARISON:  10/27/2016 FINDINGS: Lungs demonstrate no consolidation or effusion. Heart size within normal limits. Aortic atherosclerosis. No pneumothorax. IMPRESSION: No active disease. Electronically Signed   By: Jasmine Pang M.D.   On: 10/27/2016 20:01   Dg Hip Operative Unilat W Or W/o Pelvis Left  Result Date: 10/28/2016 CLINICAL DATA:  Surgical fixation for proximal femur fracture EXAM: OPERATIVE LEFT HIP   2 VIEWS TECHNIQUE: Fluoroscopic spot image(s) were submitted for interpretation post-operatively. FLUOROSCOPY TIME:  0 MINUTES 35 SECONDS; 2 ACQUIRED IMAGES COMPARISON:  Pelvis and left hip radiographs October 27, 2016 FINDINGS: Frontal lateral views were obtained. There are 3 screws transfixing a subcapital femoral neck fracture on the left. Alignment at the fracture site is essentially anatomic. Tips of screws  in proximal femoral head. No new fracture. No dislocation. Left hip joint space appears unremarkable. IMPRESSION: Fixation of subcapital femoral neck fracture on the left with alignment essentially anatomic. Screw tips in proximal femoral head. No new fracture. No dislocation. Electronically Signed   By: Bretta BangWilliam  Woodruff III M.D.   On: 10/28/2016 13:51   Dg Hip Unilat W Or Wo Pelvis 2-3 Views Left  Result Date: 10/27/2016 CLINICAL DATA:  Trip and fall with left hip pain. EXAM: DG HIP (WITH OR WITHOUT PELVIS) 2-3V LEFT COMPARISON:  None. FINDINGS: Impacted fracture of the left femoral neck with minimal displacement. No significant angulation. New migration of the femoral shaft. The remainder the bony pelvis is intact without additional acute fracture. Age related degenerative change of both hips and sacroiliac joints. IMPRESSION: Impacted left femoral neck fracture with minimal displacement. Electronically  Signed   By: Rubye OaksMelanie  Ehinger M.D.   On: 10/27/2016 19:27    Assessment/Plan: 1 Day Post-Op   Active Problems:   Hip fracture (HCC)   Advance diet Up with therapy, WBAT LLE Discharge to SNF  Remove Foley today  Cleared for transfer from an orthopedic stand point. Return to clinic in 2 weeks for staple removal   Lyndle HerrlichJames R Braylen Denunzio , MD 10/29/2016, 7:56 AM

## 2016-10-29 NOTE — Progress Notes (Addendum)
Occupational Therapy Evaluation Patient Details Name: Ashley Cantrell MRN: [id number] DOB: [birth year] Today's Date: 10/29/2016    History of present illness Pt. is an 83 y.o. female who [facility] for cannulated pinning repair of a Left Hip Fracture.   OT comments  Pt. is an 83 y.o. Female who was [facility] for cannulated pinning repair of a Left Hip Fracture.  Pt. Resides at home with her daughter. Pt. was independent with basic ADL, and IADL care. Pt. Is very hard of hearing, and responds well to written communication. Pt. presents with weakness, pain, and limited functional mobility which all hinder her ability to complete ADLs, and IADLs. Pt. Has a reacher at home. Pt. was provided with education about proper use of A/E for use during ADLs, and IADLs.Pt. Will benefit from skilled OT services for ADL training, A/E training, UE there. Ex, functional mobility for ADLs, and pt. Education about home modification, and DME. Pt. Will benefit from SNF level of care upon discharge with follow-up OT services.   Follow Up Recommendations  SNF    Equipment Recommendations       Recommendations for Other Services      Precautions / Restrictions Precautions Precautions: Fall Restrictions Weight Bearing Restrictions: Yes LLE Weight Bearing: Weight bearing as tolerated              ADL either performed or assessed with clinical judgement   ADL Overall ADL's : Needs assistance/impaired Eating/Feeding: Independent;Set up   Grooming: Set up;Independent   Upper Body Bathing: Minimal assistance;Set up   Lower Body Bathing: Maximal assistance;Set up   Upper Body Dressing : Minimal assistance;Set up   Lower Body Dressing: Set up;Maximal assistance               Functional mobility during ADLs: Set up       Vision Baseline Vision/History: Wears glasses Wears Glasses: At all times Patient Visual Report: No change from baseline     Perception     Praxis       Cognition Arousal/Alertness: Awake/alert Behavior During Therapy: WFL for tasks assessed/performed Overall Cognitive Status: Within Functional Limits for tasks assessed                                          Exercises   Shoulder Instructions       General Comments      Pertinent Vitals/ Pain       Pain Assessment: Faces Pain Score: 2  Faces Pain Scale: Hurts a little bit Pain Location: Left LE Pain Descriptors / Indicators: Aching;Grimacing Pain Intervention(s): Limited activity within patient's tolerance;Monitored during session;Repositioned  Home Living Family/patient expects to be discharged to:: Private residence Living Arrangements: Children Available Help at Discharge: Available 24 hours/day Type of Home: House Home Access: Stairs to enter Secretary/administratorntrance Stairs-Number of Steps: 3   Home Layout: One level     Bathroom Shower/Tub: Tub/shower unit;Curtain         Home Equipment: None   Additional Comments: Patient is very HOH.      Prior Functioning/Environment Level of Independence: Independent        Comments: Independent with ADLS. Resides with her daughter   Frequency  Min 2X/week        Progress Toward Goals  OT Goals(current goals can now be found in the care plan section)     Acute Rehab  OT Goals Patient Stated Goal: to get up and move around for the first time ADL Goals Pt Will Perform Lower Body Dressing: with modified independence Pt Will Transfer to Toilet: with modified independence  Plan      Co-evaluation                 AM-PAC PT "6 Clicks" Daily Activity     Outcome Measure   Help from another person eating meals?: None Help from another person taking care of personal grooming?: A Little Help from another person toileting, which includes using toliet, bedpan, or urinal?: A Lot Help from another person bathing (including washing, rinsing, drying)?: A Lot Help from another person to put on and taking  off regular upper body clothing?: A Little Help from another person to put on and taking off regular lower body clothing?: A Lot 6 Click Score: 16    End of Session Equipment Utilized During Treatment: Gait belt  OT Visit Diagnosis: Muscle weakness (generalized) (M62.81)   Activity Tolerance Patient tolerated treatment well   Patient Left in bed   Nurse Communication      Functional Limitation: Self care Self Care Current Status (Z6109): At least 40 percent but less than 60 percent impaired, limited or restricted Self Care Goal Status (U0454): At least 1 percent but less than 20 percent impaired, limited or restricted   Time: 1015-1030 OT Time Calculation (min): 15 min  Charges: OT G-codes **NOT FOR INPATIENT CLASS** Functional Limitation: Self care Self Care Current Status (U9811): At least 40 percent but less than 60 percent impaired, limited or restricted Self Care Goal Status (B1478): At least 1 percent but less than 20 percent impaired, limited or restricted OT General Charges $OT Visit: 1 Visit OT Evaluation $OT Eval Low Complexity: 1 Low  Olegario Messier, MS, OTR/L  Olegario Messier, MS, OTR/L 10/29/2016, 2:31 PM

## 2016-10-29 NOTE — Care Management (Signed)
Attempted to call daughter with no answer. No family in room

## 2016-10-30 ENCOUNTER — Encounter: Payer: Self-pay | Admitting: Specialist

## 2016-10-30 LAB — HEMOGLOBIN: HEMOGLOBIN: 12.1 g/dL (ref 12.0–16.0)

## 2016-10-30 LAB — COMPREHENSIVE METABOLIC PANEL
ALBUMIN: 3 g/dL — AB (ref 3.5–5.0)
ALK PHOS: 44 U/L (ref 38–126)
ALT: 10 U/L — AB (ref 14–54)
AST: 27 U/L (ref 15–41)
Anion gap: 4 — ABNORMAL LOW (ref 5–15)
BILIRUBIN TOTAL: 1 mg/dL (ref 0.3–1.2)
BUN: 17 mg/dL (ref 6–20)
CHLORIDE: 107 mmol/L (ref 101–111)
CO2: 28 mmol/L (ref 22–32)
CREATININE: 0.91 mg/dL (ref 0.44–1.00)
Calcium: 8.5 mg/dL — ABNORMAL LOW (ref 8.9–10.3)
GFR calc Af Amer: >60 mL/min (ref >60)
GFR calc non Af Amer: 54 mL/min — ABNORMAL LOW (ref >60)
Glucose, Bld: 88 mg/dL (ref 65–99)
Potassium: 3.7 mmol/L (ref 3.5–5.1)
Sodium: 139 mmol/L (ref 135–145)
Total Protein: 5.6 g/dL — ABNORMAL LOW (ref 6.5–8.1)

## 2016-10-30 MED ORDER — ENOXAPARIN SODIUM 40 MG/0.4ML ~~LOC~~ SOLN
40.0000 mg | SUBCUTANEOUS | Status: DC
  Administered 2016-10-30: 40 mg via SUBCUTANEOUS
  Filled 2016-10-30: qty 0.4

## 2016-10-30 MED ORDER — ACETAMINOPHEN 325 MG PO TABS: 650.0000 mg | ORAL_TABLET | Freq: Four times a day (QID) | ORAL | Status: DC | PRN

## 2016-10-30 MED ORDER — HYDROCODONE-ACETAMINOPHEN 5-325 MG PO TABS: 1.0000 | ORAL_TABLET | ORAL | 0 refills | Status: DC | PRN

## 2016-10-30 MED ORDER — ENOXAPARIN SODIUM 40 MG/0.4ML ~~LOC~~ SOLN: 40.0000 mg | SUBCUTANEOUS | 0 refills | Status: DC

## 2016-10-30 NOTE — Progress Notes (Signed)
OT Cancellation Note  Patient Details Name: Ashley Cantrell MRN: [id number] DOB: 1926-02-17   Cancelled Treatment:    Reason Eval/Treat Not Completed: Other (comment). Pt with several family members in the room upon attempt, NSG with pt, preparing for discharge home today. Will re-attempt at later date/time as pt is available and as schedule allows.  Richrd PrimeJamie Stiller, MPH, MS, OTR/L ascom [phone] 10/30/16, 1:44 PM

## 2016-10-30 NOTE — Progress Notes (Signed)
Physical Therapy Treatment Patient Details Name: Ashley Cantrell MRN: [id number] DOB: May 06, 1925 Today's Date: 10/30/2016    History of Present Illness presented to ER secondary to fall in kitchen with acute onset of L hip pain; admitted secondary to displaced L femoral neck fracture, status post cannulated hip pinning (10/28/16).    PT Comments    Pt ambulated x 1 around nursing unit with ease and RW.  Minor discomfort noted L hip but overall does well without LOB or buckling.  Good safety and awareness of obstacles in hallway and navigates without assist.  Follow Up Recommendations  Home health PT     Equipment Recommendations  Rolling walker with 5" wheels;3in1 (PT)    Recommendations for Other Services       Precautions / Restrictions Precautions Precautions: Fall Restrictions Weight Bearing Restrictions: Yes LLE Weight Bearing: Weight bearing as tolerated    Mobility  Bed Mobility Overal bed mobility: Modified Independent                Transfers Overall transfer level: Needs assistance Equipment used: Rolling walker (2 wheeled) Transfers: Sit to/from Stand Sit to Stand: Supervision         General transfer comment: verbal/demo cues for hand placements  Ambulation/Gait Ambulation/Gait assistance: Min guard Ambulation Distance (Feet): 200 Feet Assistive device: Rolling walker (2 wheeled)       General Gait Details: no LOB, generally steady   Stairs            Wheelchair Mobility    Modified Rankin (Stroke Patients Only)       Balance Overall balance assessment: Needs assistance Sitting-balance support: No upper extremity supported;Feet supported Sitting balance-Leahy Scale: Good     Standing balance support: Bilateral upper extremity supported Standing balance-Leahy Scale: Fair                              Cognition Arousal/Alertness: Awake/alert Behavior During Therapy: WFL for tasks assessed/performed Overall  Cognitive Status: Within Functional Limits for tasks assessed                                        Exercises Other Exercises Other Exercises: standing exercises for heel raises, marches, SLR x 10    General Comments        Pertinent Vitals/Pain Pain Assessment: 0-10 Pain Score: 2  Faces Pain Scale: Hurts a little bit Pain Location: Left LE Pain Descriptors / Indicators: Aching;Grimacing Pain Intervention(s): Limited activity within patient's tolerance    Home Living                      Prior Function            PT Goals (current goals can now be found in the care plan section) Progress towards PT goals: Progressing toward goals    Frequency    BID      PT Plan Current plan remains appropriate    Co-evaluation              AM-PAC PT "6 Clicks" Daily Activity  Outcome Measure  Difficulty turning over in bed (including adjusting bedclothes, sheets and blankets)?: None Difficulty moving from lying on back to sitting on the side of the bed? : None Difficulty sitting down on and standing up from a chair with arms (e.g., wheelchair, bedside commode,  etc,.)?: None Help needed moving to and from a bed to chair (including a wheelchair)?: A Little Help needed walking in hospital room?: A Little Help needed climbing 3-5 steps with a railing? : A Little 6 Click Score: 21    End of Session Equipment Utilized During Treatment: Gait belt Activity Tolerance: Patient tolerated treatment well Patient left: in chair;with chair alarm set;with call bell/phone within reach;with family/visitor present   Pain - Right/Left: Left Pain - part of body: Hip     Time: [id number] PT Time Calculation (min) (ACUTE ONLY): 10 min  Charges:  $Gait Training: 8-22 mins                    G Codes:       Danielle DessSarah Barbette Mcglaun, PTA 10/30/16, 11:59 AM

## 2016-10-30 NOTE — Care Management Note (Signed)
Case Management Note  Patient Details  Name: Myrtha MantisMae L Fowle MRN: [id number] Date of Birth: April 04, 1925  Subjective/Objective:    Discussed discharge planning with Dr Cherlynn KaiserSainani who reports that he is discharging [facility] with PT only. A RW and B[facility]. Call [facility] per HH-PT order.                 Action/Plan:   Expected Discharge Date:                  Expected Discharge Plan:  Home w Home Health Services  In-House Referral:     Discharge planning Services  CM Consult  Post Acute Care Choice:  Home Health Choice offered to:  Adult Children (Danille, neice)  DME Arranged:  Dan HumphreysWalker, Bedside commode DME Agency:  [facility]  HH Arranged:  PT HH Agency:  [facility]  Status of Service:  Completed, signed off  If discussed at Long Length of Stay Meetings, dates discussed:    Additional Comments:  Attallah Ontko A, RN 10/30/2016, 11:59 AM

## 2016-10-30 NOTE — Progress Notes (Signed)
Physical Therapy Treatment Patient Details Name: Ashley MantisMae L Googe MRN: [id number] DOB: [birth year] Today's Date: 10/30/2016    History of Present Illness presented to ER secondary to fall in kitchen with acute onset of L hip pain; admitted secondary to displaced L femoral neck fracture, status post cannulated hip pinning (10/28/16).    PT Comments    Participated in exercises as described below.  Pt able to transition out of bed with ease.  She ambulated around nursing unit x 1 with walker and min guard.  Some c/o soreness L hip but generally tolerates well.  No LOB and good safety.  Pt is able to read written communication well vs [facility]   Follow Up Recommendations  Home health PT     Equipment Recommendations  Rolling walker with 5" wheels;3in1 (PT)    Recommendations for Other Services       Precautions / Restrictions Precautions Precautions: Fall Restrictions Weight Bearing Restrictions: Yes LLE Weight Bearing: Weight bearing as tolerated    Mobility  Bed Mobility Overal bed mobility: Modified Independent                Transfers Overall transfer level: Needs assistance Equipment used: Rolling walker (2 wheeled) Transfers: Sit to/from Stand Sit to Stand: Supervision         General transfer comment: verbal/demo cues for hand placements  Ambulation/Gait Ambulation/Gait assistance: Min guard Ambulation Distance (Feet): 200 Feet Assistive device: Rolling walker (2 wheeled)       General Gait Details: no LOB, generally steady   Stairs            Wheelchair Mobility    Modified Rankin (Stroke Patients Only)       Balance Overall balance assessment: Needs assistance Sitting-balance support: No upper extremity supported;Feet supported Sitting balance-Leahy Scale: Good     Standing balance support: Bilateral upper extremity supported Standing balance-Leahy Scale: Fair                              Cognition  Arousal/Alertness: Awake/alert Behavior During Therapy: WFL for tasks assessed/performed Overall Cognitive Status: Within Functional Limits for tasks assessed                                        Exercises Other Exercises Other Exercises: standing exercises for heel raises, marches, SLR x 10    General Comments        Pertinent Vitals/Pain Pain Assessment: Faces Faces Pain Scale: Hurts a little bit Pain Location: Left LE Pain Descriptors / Indicators: Aching;Grimacing Pain Intervention(s): Limited activity within patient's tolerance    Home Living                      Prior Function            PT Goals (current goals can now be found in the care plan section) Progress towards PT goals: Progressing toward goals    Frequency    BID      PT Plan Current plan remains appropriate    Co-evaluation              AM-PAC PT "6 Clicks" Daily Activity  Outcome Measure  Difficulty turning over in bed (including adjusting bedclothes, sheets and blankets)?: None Difficulty moving from lying on back to sitting on the side of the bed? :  None Difficulty sitting down on and standing up from a chair with arms (e.g., wheelchair, bedside commode, etc,.)?: A Little Help needed moving to and from a bed to chair (including a wheelchair)?: A Little Help needed walking in hospital room?: A Little Help needed climbing 3-5 steps with a railing? : A Little 6 Click Score: 20    End of Session Equipment Utilized During Treatment: Gait belt Activity Tolerance: Patient tolerated treatment well Patient left: in chair;with chair alarm set;with call bell/phone within reach   Pain - Right/Left: Left Pain - part of body: Hip     Time: 1610-9604 PT Time Calculation (min) (ACUTE ONLY): 18 min  Charges:  $Gait Training: 8-22 mins                    G Codes:       Danielle Dess, PTA 10/30/16, 9:53 AM

## 2016-10-30 NOTE — Progress Notes (Signed)
Patient is being discharged home today with HH. Daughter is here to take her home and will assist her at home.  Nurse gave daughter lovenox education and assisted her with her first injection. DC and RX instructions given and they acknowledged understanding. IV removed and belongings packed.

## 2016-10-30 NOTE — Discharge Summary (Addendum)
[facility] [place] at [facility]   PATIENT NAME: Ashley Cantrell    MR#:  [id number]  DATE OF BIRTH:  12-21-1925  DATE OF ADMISSION:  10/27/2016 ADMITTING PHYSICIAN: Gracelyn Nurse, MD  DATE OF DISCHARGE: No discharge date for patient encounter.  PRIMARY CARE PHYSICIAN: [facility]    ADMISSION DIAGNOSIS:  Closed fracture of left hip, initial encounter (HCC) [S72.002A]  DISCHARGE DIAGNOSIS:  Active Problems:   Hip fracture (HCC)   SECONDARY DIAGNOSIS:   Past Medical History:  Diagnosis Date  . Anxiety   . Diabetes mellitus without complication (HCC)   . Essential hypertension   . Glaucoma   . Thyroid disease     HOSPITAL COURSE:   83 year old female with past medical history of diabetes, hypothyroidism,who presented to the hospital after a mechanical fall and noted to have a left hip fracture.  1. Status post fall and left hip fracture-Patient was admitted to the hospital and orthopedic consults obtained. -Patient is status post open reduction internal fixation and percutaneous pinning of her left hip. She is presently postop day #2. Her pain is well-controlled with some Tylenol/hydrocodone as needed. -Patient was seen by physical therapy who recommended home health services which is being arranged for her prior to discharge.  - pt. Will be discharged with Home Health PT.  Pt. Will be discharged on Lovenox Daily for DVT prophylaxis for 14 days.   2. Hypothyroidism- pt. Will continue Synthroid.  3. Diabetes type 2 without complication- pt. Was on SSI while in hospital but at home pt. Is not on any meds and will cont. Carb control diet.   4. Glaucoma- she will continue dorzolamide, timolol eyedrops  5. Anxiety- she will continue Xanax as needed.  6. Essential hypertension- she will continue Norvasc.  7. Hyperlipidemia- she will continue atorvastatin.  DISCHARGE CONDITIONS:   Stable.   CONSULTS OBTAINED:  Treatment Team:   Lyndle Herrlich, MD  DRUG ALLERGIES:  No Known Allergies  DISCHARGE MEDICATIONS:   Allergies as of 10/30/2016   No Known Allergies     Medication List    STOP taking these medications   trimethoprim-polymyxin b ophthalmic solution Commonly known as:  POLYTRIM     TAKE these medications   acetaminophen 325 MG tablet Commonly known as:  TYLENOL Take 2 tablets (650 mg total) by mouth every 6 (six) hours as needed.   ALPRAZolam 0.25 MG tablet Commonly known as:  XANAX Take 0.25 mg by mouth daily as needed.   amLODipine 5 MG tablet Commonly known as:  NORVASC Take 5 mg by mouth daily.   aspirin EC 81 MG tablet Take 81 mg by mouth daily.   atorvastatin 20 MG tablet Commonly known as:  LIPITOR Take 20 mg by mouth daily.   dorzolamide-timolol 22.3-6.8 MG/ML ophthalmic solution Commonly known as:  COSOPT Place 1 drop into both eyes 2 (two) times daily.   enoxaparin 40 MG/0.4ML injection Commonly known as:  LOVENOX Inject 0.4 mLs (40 mg total) into the skin daily.   HYDROcodone-acetaminophen 5-325 MG tablet Commonly known as:  NORCO/VICODIN Take 1-2 tablets by mouth every 4 (four) hours as needed (breakthrough pain).   levothyroxine 75 MCG tablet Commonly known as:  SYNTHROID, LEVOTHROID Take 75 mcg by mouth daily before breakfast.            Durable Medical Equipment        Start     Ordered   10/29/16 1605  For home use only DME Bedside  commode  Once    Question:  Patient needs a bedside commode to treat with the following condition  Answer:  Hip fracture (HCC)   10/29/16 1604   10/29/16 1604  For home use only DME Walker rolling  Once    Question:  Patient needs a walker to treat with the following condition  Answer:  Hip fracture (HCC)   10/29/16 1604       Discharge Care Instructions        Start     Ordered   10/30/16 0000  Activity as tolerated - No restrictions     10/30/16 1202   10/30/16 0000  Diet general     10/30/16 1202   10/30/16  0000  acetaminophen (TYLENOL) 325 MG tablet  Every 6 hours PRN     10/30/16 1202   10/30/16 0000  HYDROcodone-acetaminophen (NORCO/VICODIN) 5-325 MG tablet  Every 4 hours PRN     10/30/16 1212   10/30/16 0000  enoxaparin (LOVENOX) 40 MG/0.4ML injection  Every 24 hours     10/30/16 1352        DISCHARGE INSTRUCTIONS:   DIET:  Cardiac diet and Diabetic diet  DISCHARGE CONDITION:  Stable  ACTIVITY:  Activity as tolerated  OXYGEN:  Home Oxygen: No.   Oxygen Delivery: room air  DISCHARGE LOCATION:  Home with Home Health PT.    If you experience worsening of your admission symptoms, develop shortness of breath, life threatening emergency, suicidal or homicidal thoughts you must seek medical attention immediately by calling 911 or calling your MD immediately  if symptoms less severe.  You Must read complete instructions/literature along with all the possible adverse reactions/side effects for all the Medicines you take and that have been prescribed to you. Take any new Medicines after you have completely understood and accpet all the possible adverse reactions/side effects.   Please note  You were cared for by a hospitalist during your hospital stay. If you have any questions about your discharge medications or the care you received while you were in the hospital after you are discharged, you can call the unit and asked to speak with the hospitalist on call if the hospitalist that took care of you is not available. Once you are discharged, your primary care physician will handle any further medical issues. Please note that NO REFILLS for any discharge medications will be authorized once you are discharged, as it is imperative that you return to your primary care physician (or establish a relationship with a primary care physician if you do not have one) for your aftercare needs so that they can reassess your need for medications and monitor your lab values.     Today   Pt. Denies  any Left hip pain, N/V.  Doing well with PT and will d/c home today with Home Health services.   VITAL SIGNS:  Blood pressure (!) 144/61, pulse 68, temperature 98.1 F (36.7 C), temperature source Oral, resp. rate 18, height  (1.651 m), weight 53.9 kg (118 lb 14.4 oz), SpO2 99 %.  I/O:    Intake/Output Summary (Last 24 hours) at 10/30/16 1352 Last data filed at 10/30/16 0800  Gross per 24 hour  Intake              720 ml  Output                0 ml  Net              720  ml    PHYSICAL EXAMINATION:    GENERAL:  83 y.o.-year-old patient sitting up in chair in no acute distress.  EYES: Pupils equal, round, reactive to light and accommodation. No scleral icterus. Extraocular muscles intact.  HEENT: Head atraumatic, normocephalic. Oropharynx and nasopharynx clear. Very hard of hearing.  NECK:  Supple, no jugular venous distention. No thyroid enlargement, no tenderness.  LUNGS: Normal breath sounds bilaterally, no wheezing, rales, rhonchi. No use of accessory muscles of respiration.  CARDIOVASCULAR: S1, S2 normal. No murmurs, rubs, or gallops.  ABDOMEN: Soft, nontender, nondistended. Bowel sounds present. No organomegaly or mass.  EXTREMITIES: No cyanosis, clubbing or edema b/l.  Left hip dressing from recent Hip surgery.   NEUROLOGIC: Cranial nerves II through XII are intact. No focal Motor or sensory deficits b/l.   PSYCHIATRIC: The patient is alert and oriented x 3.  SKIN: No obvious rash, lesion, or ulcer.   DATA REVIEW:   CBC  Recent Labs Lab 10/28/16 0436 10/30/16 0439  WBC 7.1  --   HGB 12.6 12.1  HCT 36.1  --   PLT 256  --     Chemistries   Recent Labs Lab 10/30/16 0439  NA 139  K 3.7  CL 107  CO2 28  GLUCOSE 88  BUN 17  CREATININE 0.91  CALCIUM 8.5*  AST 27  ALT 10*  ALKPHOS 44  BILITOT 1.0    Cardiac Enzymes No results for input(s): TROPONINI in the last 168 hours.  Microbiology Results  Results for orders placed or performed during the  hospital encounter of 10/27/16  MRSA PCR Screening     Status: None   Collection Time: 10/28/16 10:05 AM  Result Value Ref Range Status   MRSA by PCR NEGATIVE NEGATIVE Final    Comment:        The GeneXpert MRSA Assay (FDA approved for NASAL specimens only), is one component of a comprehensive MRSA colonization surveillance program. It is not intended to diagnose MRSA infection nor to guide or monitor treatment for MRSA infections.     RADIOLOGY:  No results found.    Management plans discussed with the patient, family and they are in agreement.  CODE STATUS:     Code Status Orders        Start     Ordered   10/27/16 2205  Full code  Continuous     10/27/16 2204    Code Status History    Date Active Date Inactive Code Status Order ID Comments User Context   This patient has a current code status but no historical code status.      TOTAL TIME TAKING CARE OF THIS PATIENT: 40 minutes.    Houston SirenSAINANI,Nasiya Pascual J M.D on 10/30/2016 at 1:52 PM  Between 7am to 6pm - Pager - [phone]  After 6pm go to [email or web address] - Therapist, nutritionalpassword EPAS [facility]  [facility]  Office  [phone]  CC: Primary care physician; [facility]

## 2017-04-18 ENCOUNTER — Other Ambulatory Visit: Payer: Self-pay | Admitting: Internal Medicine

## 2017-04-18 DIAGNOSIS — Z1231 Encounter for screening mammogram for malignant neoplasm of breast: Secondary | ICD-10-CM

## 2017-06-21 ENCOUNTER — Ambulatory Visit
Admission: RE | Admit: 2017-06-21 | Discharge: 2017-06-21 | Disposition: A | Discharge status: Home / Self Care | Payer: Medicare HMO | Source: Ambulatory Visit | Attending: Internal Medicine | Admitting: Internal Medicine

## 2017-06-21 DIAGNOSIS — Z1231 Encounter for screening mammogram for malignant neoplasm of breast: Secondary | ICD-10-CM | POA: Insufficient documentation

## 2017-11-26 ENCOUNTER — Emergency Department: Payer: Medicare HMO

## 2017-11-26 ENCOUNTER — Emergency Department
Admission: EM | Admit: 2017-11-26 | Discharge: 2017-11-26 | Disposition: A | Discharge status: Home / Self Care | Payer: Medicare HMO | Attending: Emergency Medicine | Admitting: Emergency Medicine

## 2017-11-26 ENCOUNTER — Other Ambulatory Visit: Payer: Self-pay

## 2017-11-26 ENCOUNTER — Encounter: Payer: Self-pay | Admitting: Emergency Medicine

## 2017-11-26 DIAGNOSIS — M79661 Pain in right lower leg: Secondary | ICD-10-CM | POA: Diagnosis present

## 2017-11-26 DIAGNOSIS — Z7982 Long term (current) use of aspirin: Secondary | ICD-10-CM | POA: Insufficient documentation

## 2017-11-26 DIAGNOSIS — I10 Essential (primary) hypertension: Secondary | ICD-10-CM | POA: Diagnosis not present

## 2017-11-26 DIAGNOSIS — E119 Type 2 diabetes mellitus without complications: Secondary | ICD-10-CM | POA: Insufficient documentation

## 2017-11-26 DIAGNOSIS — L03115 Cellulitis of right lower limb: Secondary | ICD-10-CM

## 2017-11-26 DIAGNOSIS — Z79899 Other long term (current) drug therapy: Secondary | ICD-10-CM | POA: Diagnosis not present

## 2017-11-26 LAB — CBC WITH DIFFERENTIAL/PLATELET
BASOS ABS: 0 10*3/uL (ref 0–0.1)
BASOS PCT: 1 %
EOS ABS: 0.3 10*3/uL (ref 0–0.7)
Eosinophils Relative: 7 %
HEMATOCRIT: 36.5 % (ref 35.0–47.0)
Hemoglobin: 12 g/dL (ref 12.0–16.0)
Lymphocytes Relative: 13 %
Lymphs Abs: 0.6 10*3/uL — ABNORMAL LOW (ref 1.0–3.6)
MCH: 30.6 pg (ref 26.0–34.0)
MCHC: 32.9 g/dL (ref 32.0–36.0)
MCV: 92.9 fL (ref 80.0–100.0)
MONO ABS: 0.4 10*3/uL (ref 0.2–0.9)
MONOS PCT: 8 %
NEUTROS ABS: 3.3 10*3/uL (ref 1.4–6.5)
NEUTROS PCT: 71 %
Platelets: 355 10*3/uL (ref 150–440)
RBC: 3.93 MIL/uL (ref 3.80–5.20)
RDW: 14.1 % (ref 11.5–14.5)
WBC: 4.6 10*3/uL (ref 3.6–11.0)

## 2017-11-26 LAB — COMPREHENSIVE METABOLIC PANEL
ALK PHOS: 69 U/L (ref 38–126)
ALT: 14 U/L (ref 0–44)
ANION GAP: 8 (ref 5–15)
AST: 25 U/L (ref 15–41)
Albumin: 3.8 g/dL (ref 3.5–5.0)
BILIRUBIN TOTAL: 1.3 mg/dL — AB (ref 0.3–1.2)
BUN: 16 mg/dL (ref 8–23)
CALCIUM: 9.1 mg/dL (ref 8.9–10.3)
CO2: 26 mmol/L (ref 22–32)
Chloride: 98 mmol/L (ref 98–111)
Creatinine, Ser: 0.81 mg/dL (ref 0.44–1.00)
GFR calc non Af Amer: >60 mL/min (ref >60)
Glucose, Bld: 117 mg/dL — ABNORMAL HIGH (ref 70–99)
Potassium: 4.1 mmol/L (ref 3.5–5.1)
Sodium: 132 mmol/L — ABNORMAL LOW (ref 135–145)
TOTAL PROTEIN: 6.7 g/dL (ref 6.5–8.1)

## 2017-11-26 LAB — LACTIC ACID, PLASMA: Lactic Acid, Venous: 0.9 mmol/L (ref 0.5–1.9)

## 2017-11-26 MED ORDER — CEPHALEXIN 500 MG PO CAPS: 500.0000 mg | ORAL_CAPSULE | Freq: Three times a day (TID) | ORAL | 0 refills | Status: DC

## 2017-11-26 MED ORDER — ONDANSETRON 4 MG PO TBDP: 4.0000 mg | ORAL_TABLET | Freq: Three times a day (TID) | ORAL | 0 refills | Status: DC | PRN

## 2017-11-26 MED ORDER — SULFAMETHOXAZOLE-TRIMETHOPRIM 800-160 MG PO TABS
1.0000 | ORAL_TABLET | Freq: Once | ORAL | Status: AC
  Administered 2017-11-26: 1 via ORAL
  Filled 2017-11-26: qty 1

## 2017-11-26 MED ORDER — CEPHALEXIN 500 MG PO CAPS
500.0000 mg | ORAL_CAPSULE | Freq: Once | ORAL | Status: AC
  Administered 2017-11-26: 500 mg via ORAL
  Filled 2017-11-26: qty 1

## 2017-11-26 MED ORDER — SULFAMETHOXAZOLE-TRIMETHOPRIM 800-160 MG PO TABS: 1.0000 | ORAL_TABLET | Freq: Two times a day (BID) | ORAL | 0 refills | Status: DC

## 2017-11-26 NOTE — ED Notes (Signed)
Patient transported to [facility] 

## 2017-11-26 NOTE — ED Triage Notes (Signed)
Pt has had right leg pain since yesterday. Swelling and redness noted from foot to almost knee.  Warm to touch.  Tried to give her one pill last night for leg of an abx that was left over from a cough.  explained need to finish abx when given by doctor and not to treat an illness with left over medication.    Used stratus [id number] to help communicate.  Daughter deaf and pt is extremely hard of hearing.

## 2017-11-26 NOTE — ED Provider Notes (Signed)
[facility] Emergency Department Provider Note  ____________________________________________  Time seen: Approximately 1:00 PM  I have reviewed the triage vital signs and the nursing notes.   HISTORY  Chief Complaint Leg Pain    HPI Ashley Cantrell is a 84 y.o. female who comes to the ED due to pain swelling and redness of the right lower extremity that started yesterday.  Constant, worsening, moderate intensity.  No aggravating or alleviating factors.  Denies chest pain shortness of breath fever chills.  She feels like she has her strength and denies weakness.  No paresthesias or numbness in the leg.  No recent falls or injuries.  She has a history of DVT after a hip surgery last year.  Also has a history of hypertension.      Past Medical History:  Diagnosis Date  . Anxiety   . Diabetes mellitus without complication (HCC)   . Essential hypertension   . Glaucoma   . Thyroid disease      Patient Active Problem List   Diagnosis Date Noted  . Hip fracture (HCC) 10/27/2016     Past Surgical History:  Procedure Laterality Date  . CATARACT EXTRACTION    . HIP PINNING,CANNULATED Left 10/28/2016   Procedure: CANNULATED HIP PINNING;  Surgeon: Lyndle Herrlich, MD;  Location: [facility] ORS;  Service: Orthopedics;  Laterality: Left;     Prior to Admission medications   Medication Sig Start Date End Date Taking? Authorizing Provider  acetaminophen (TYLENOL) 325 MG tablet Take 2 tablets (650 mg total) by mouth every 6 (six) hours as needed. 10/30/16   Houston Siren, MD  amLODipine (NORVASC) 5 MG tablet Take 5 mg by mouth daily.    [provider]  aspirin EC 81 MG tablet Take 81 mg by mouth daily.    [provider]  atorvastatin (LIPITOR) 20 MG tablet Take 20 mg by mouth daily.    [provider]  cephALEXin (KEFLEX) 500 MG capsule Take 1 capsule (500 mg total) by mouth 3 (three) times daily. 11/26/17   Sharman Cheek, MD   dorzolamide-timolol (COSOPT) 22.3-6.8 MG/ML ophthalmic solution Place 1 drop into both eyes 2 (two) times daily.    [provider]  enoxaparin (LOVENOX) 40 MG/0.4ML injection Inject 0.4 mLs (40 mg total) into the skin daily. 10/30/16 11/13/16  Houston Siren, MD  HYDROcodone-acetaminophen (NORCO/VICODIN) 5-325 MG tablet Take 1-2 tablets by mouth every 4 (four) hours as needed (breakthrough pain). 10/30/16   Houston Siren, MD  levothyroxine (SYNTHROID, LEVOTHROID) 75 MCG tablet Take 75 mcg by mouth daily before breakfast.    [provider]  ondansetron (ZOFRAN ODT) 4 MG disintegrating tablet Take 1 tablet (4 mg total) by mouth every 8 (eight) hours as needed for nausea or vomiting. 11/26/17   Sharman Cheek, MD  sulfamethoxazole-trimethoprim (BACTRIM DS) 800-160 MG tablet Take 1 tablet by mouth 2 (two) times daily. 11/26/17   Sharman Cheek, MD     Allergies Patient has no known allergies.   Family History  Problem Relation Age of Onset  . Breast cancer Neg Hx     Social History Social History   Tobacco Use  . Smoking status: Never Smoker  . Smokeless tobacco: Never Used  Substance Use Topics  . Alcohol use: No  . Drug use: Not on file    Review of Systems  Constitutional:   No fever or chills.  Cardiovascular:   No chest pain or syncope. Respiratory:   No dyspnea or  cough. Gastrointestinal:   Negative for abdominal pain, vomiting and diarrhea.  Musculoskeletal:   Left lower leg pain and swelling as above All other systems reviewed and are negative except as documented above in ROS and HPI.  ____________________________________________   PHYSICAL EXAM:  VITAL SIGNS: ED Triage Vitals [11/26/17 1043]  Enc Vitals Group     BP (!) 118/91     Pulse Rate 76     Resp 16     Temp 98 F (36.7 C)     Temp Source Oral     SpO2 100 %     Weight      Height      Head Circumference      Peak Flow      Pain Score      Pain Loc      Pain Edu?       Excl. in GC?     Vital signs reviewed, nursing assessments reviewed.   Constitutional:   Alert and oriented. Non-toxic appearance. Eyes:   Conjunctivae are normal. EOMI. PERRL. ENT      Head:   Normocephalic and atraumatic.      Nose:   No congestion/rhinnorhea.       Mouth/Throat:   MMM, no pharyngeal erythema. No peritonsillar mass.       Neck:   No meningismus. Full ROM. Hematological/Lymphatic/Immunilogical:   No cervical lymphadenopathy. Cardiovascular:   RRR. Symmetric bilateral radial and DP pulses.  No murmurs. Cap refill less than 2 seconds. Respiratory:   Normal respiratory effort without tachypnea/retractions. Breath sounds are clear and equal bilaterally. No wheezes/rales/rhonchi. Gastrointestinal:   Soft and nontender. Non distended. There is no CVA tenderness.  No rebound, rigidity, or guarding. Musculoskeletal:   Normal range of motion in all extremities. No joint effusions.  Erythema of the right lower leg extending from above the ankle to just below the knee.  Well demarcated borders, no streaking.  No fluctuance crepitus wounds or drainage.  No palpable cord, negative Homans sign.  Left leg and other extremity is unremarkable. Neurologic:   Normal speech and language.  Motor grossly intact. No acute focal neurologic deficits are appreciated.  Skin:    Skin is warm, dry and intact.  Inflammatory changes of right lower leg as noted above.  No petechiae, purpura, or bullae.  ____________________________________________    LABS (pertinent positives/negatives) (all labs ordered are listed, but only abnormal results are displayed) Labs Reviewed  COMPREHENSIVE METABOLIC PANEL - Abnormal; Notable for the following components:      Result Value   Sodium 132 (*)    Glucose, Bld 117 (*)    Total Bilirubin 1.3 (*)    All other components within normal limits  CBC WITH DIFFERENTIAL/PLATELET - Abnormal; Notable for the following components:   Lymphs Abs 0.6 (*)    All other  components within normal limits  LACTIC ACID, PLASMA  LACTIC ACID, PLASMA   ____________________________________________   EKG    ____________________________________________    RADIOLOGY  US Venous Img Lower Unilateral Right  Result Date: 11/26/2017 CLINICAL DATA:  Pain, redness, swelling EXAM: RIGHT LOWER EXTREMITY VENOUS DOPPLER ULTRASOUND TECHNIQUE: Gray-scale sonography with compression, as well as color and duplex ultrasound, were performed to evaluate the deep venous system from the level of the common femoral vein through the popliteal and proximal calf veins. COMPARISON:  None FINDINGS: Normal compressibility of the common femoral, superficial femoral, and popliteal veins, as well as the proximal calf veins. No filling defects to suggest DVT  on grayscale or color Doppler imaging. Doppler waveforms show normal direction of venous flow, normal respiratory phasicity and response to augmentation. Survey views of the contralateral common femoral vein are unremarkable. IMPRESSION: No evidence of right lower extremity deep vein thrombosis. Electronically Signed   By: Corlis Leak M.D.   On: 11/26/2017 14:02    ____________________________________________   PROCEDURES Procedures  ____________________________________________  DIFFERENTIAL DIAGNOSIS   Right lower leg cellulitis, DVT of right lower extremity.  No evidence of necrotizing fasciitis abscess osteomyelitis.  Doubt arterial occlusion  CLINICAL IMPRESSION / ASSESSMENT AND PLAN / ED COURSE  Pertinent labs & imaging results that were available during my care of the patient were reviewed by me and considered in my medical decision making (see chart for details).    Patient is nontoxic, not in distress, presents with normal vital signs.  Clinically has cellulitis of the right leg.  Due to her history, will need to obtain ultrasound to evaluate for possible DVT although I think this is less likely.  Engaged in shared  decision-making with the patient and her daughter.  Because she is well-appearing, not diabetic, has normal vital signs and normal labs, and feels that she can take care of herself at home, I think it would be reasonable for her to attempt oral antibiotics at home.  Offered admission.  Patient much prefers to go home and does feel that this is manageable on an outpatient basis.  I will follow-up results of ultrasound and discussed with them including return precautions.  Clinical Course as of Nov 27 1598  Sat Nov 26, 2017  1432 She is on negative.  We will treat as cellulitis with Bactrim and Keflex.   [PS]    Clinical Course User Index [PS] Sharman Cheek, MD     ----------------------------------------- 4:00 PM on 11/26/2017 -----------------------------------------  As noted above ultrasound is negative.  Discussed with patient and daughter who are very eager to be discharged and confident that this can be managed at home.  Return precautions discussed to which they understand and agree.  ____________________________________________   FINAL CLINICAL IMPRESSION(S) / ED DIAGNOSES    Final diagnoses:  Cellulitis of right lower extremity     ED Discharge Orders         Ordered    cephALEXin (KEFLEX) 500 MG capsule  3 times daily     11/26/17 1559    sulfamethoxazole-trimethoprim (BACTRIM DS) 800-160 MG tablet  2 times daily     11/26/17 1559    ondansetron (ZOFRAN ODT) 4 MG disintegrating tablet  Every 8 hours PRN     11/26/17 1559          Portions of this note were generated with dragon dictation software. Dictation errors may occur despite best attempts at proofreading.    Sharman Cheek, MD 11/26/17 [phone]

## 2017-11-26 NOTE — Discharge Instructions (Addendum)
Your ultrasound of the right leg was okay and did not show any blood clots.  Take the antibiotics as prescribed to treat the skin infection and follow-up with your doctor on Monday.  Return to the ED if you have worsening symptoms including fever vomiting or unbearable leg pain.

## 2020-04-27 ENCOUNTER — Emergency Department: Payer: Medicare HMO

## 2020-04-27 ENCOUNTER — Encounter
Admission: EM | Disposition: A | Discharge status: Skilled Nursing Facility | Payer: Self-pay | Source: Home / Self Care | Attending: Internal Medicine

## 2020-04-27 ENCOUNTER — Other Ambulatory Visit: Payer: Self-pay

## 2020-04-27 ENCOUNTER — Inpatient Hospital Stay: Payer: Medicare HMO | Admitting: Certified Registered Nurse Anesthetist

## 2020-04-27 ENCOUNTER — Inpatient Hospital Stay: Payer: Medicare HMO

## 2020-04-27 ENCOUNTER — Inpatient Hospital Stay
Admission: EM | Admit: 2020-04-27 | Discharge: 2020-05-06 | DRG: 522 | Disposition: A | Discharge status: Skilled Nursing Facility | Payer: Medicare HMO | Attending: Internal Medicine | Admitting: Internal Medicine

## 2020-04-27 DIAGNOSIS — Z419 Encounter for procedure for purposes other than remedying health state, unspecified: Secondary | ICD-10-CM

## 2020-04-27 DIAGNOSIS — E785 Hyperlipidemia, unspecified: Secondary | ICD-10-CM | POA: Diagnosis present

## 2020-04-27 DIAGNOSIS — E119 Type 2 diabetes mellitus without complications: Secondary | ICD-10-CM | POA: Diagnosis present

## 2020-04-27 DIAGNOSIS — H409 Unspecified glaucoma: Secondary | ICD-10-CM | POA: Diagnosis present

## 2020-04-27 DIAGNOSIS — F0390 Unspecified dementia without behavioral disturbance: Secondary | ICD-10-CM | POA: Diagnosis present

## 2020-04-27 DIAGNOSIS — Z79890 Hormone replacement therapy (postmenopausal): Secondary | ICD-10-CM

## 2020-04-27 DIAGNOSIS — I10 Essential (primary) hypertension: Secondary | ICD-10-CM | POA: Diagnosis present

## 2020-04-27 DIAGNOSIS — D62 Acute posthemorrhagic anemia: Secondary | ICD-10-CM | POA: Diagnosis not present

## 2020-04-27 DIAGNOSIS — M80851A Other osteoporosis with current pathological fracture, right femur, initial encounter for fracture: Principal | ICD-10-CM | POA: Diagnosis present

## 2020-04-27 DIAGNOSIS — Z20822 Contact with and (suspected) exposure to covid-19: Secondary | ICD-10-CM | POA: Diagnosis present

## 2020-04-27 DIAGNOSIS — H9190 Unspecified hearing loss, unspecified ear: Secondary | ICD-10-CM | POA: Diagnosis present

## 2020-04-27 DIAGNOSIS — M810 Age-related osteoporosis without current pathological fracture: Secondary | ICD-10-CM

## 2020-04-27 DIAGNOSIS — Z7982 Long term (current) use of aspirin: Secondary | ICD-10-CM | POA: Diagnosis not present

## 2020-04-27 DIAGNOSIS — E039 Hypothyroidism, unspecified: Secondary | ICD-10-CM | POA: Diagnosis present

## 2020-04-27 DIAGNOSIS — Z66 Do not resuscitate: Secondary | ICD-10-CM | POA: Diagnosis present

## 2020-04-27 DIAGNOSIS — E871 Hypo-osmolality and hyponatremia: Secondary | ICD-10-CM | POA: Diagnosis present

## 2020-04-27 DIAGNOSIS — H9193 Unspecified hearing loss, bilateral: Secondary | ICD-10-CM | POA: Diagnosis not present

## 2020-04-27 DIAGNOSIS — IMO0001 Reserved for inherently not codable concepts without codable children: Secondary | ICD-10-CM

## 2020-04-27 DIAGNOSIS — Z79899 Other long term (current) drug therapy: Secondary | ICD-10-CM

## 2020-04-27 DIAGNOSIS — S72001A Fracture of unspecified part of neck of right femur, initial encounter for closed fracture: Secondary | ICD-10-CM | POA: Diagnosis present

## 2020-04-27 DIAGNOSIS — Z8781 Personal history of (healed) traumatic fracture: Secondary | ICD-10-CM

## 2020-04-27 DIAGNOSIS — Z01818 Encounter for other preprocedural examination: Secondary | ICD-10-CM

## 2020-04-27 DIAGNOSIS — W010XXA Fall on same level from slipping, tripping and stumbling without subsequent striking against object, initial encounter: Secondary | ICD-10-CM | POA: Diagnosis present

## 2020-04-27 DIAGNOSIS — R296 Repeated falls: Secondary | ICD-10-CM

## 2020-04-27 HISTORY — DX: Unspecified hearing loss, unspecified ear: H91.90

## 2020-04-27 HISTORY — PX: TOTAL HIP ARTHROPLASTY: SHX124

## 2020-04-27 LAB — CBC WITH DIFFERENTIAL/PLATELET
Abs Immature Granulocytes: 0.05 10*3/uL (ref 0.00–0.07)
Basophils Absolute: 0 10*3/uL (ref 0.0–0.1)
Basophils Relative: 0 %
Eosinophils Absolute: 0 10*3/uL (ref 0.0–0.5)
Eosinophils Relative: 0 %
HCT: 38 % (ref 36.0–46.0)
Hemoglobin: 13 g/dL (ref 12.0–15.0)
Immature Granulocytes: 1 %
Lymphocytes Relative: 18 %
Lymphs Abs: 1.4 10*3/uL (ref 0.7–4.0)
MCH: 31.6 pg (ref 26.0–34.0)
MCHC: 34.2 g/dL (ref 30.0–36.0)
MCV: 92.5 fL (ref 80.0–100.0)
Monocytes Absolute: 0.6 10*3/uL (ref 0.1–1.0)
Monocytes Relative: 8 %
Neutro Abs: 5.8 10*3/uL (ref 1.7–7.7)
Neutrophils Relative %: 73 %
Platelets: 261 10*3/uL (ref 150–400)
RBC: 4.11 MIL/uL (ref 3.87–5.11)
RDW: 15.7 % — ABNORMAL HIGH (ref 11.5–15.5)
WBC: 7.8 10*3/uL (ref 4.0–10.5)
nRBC: 0 % (ref 0.0–0.2)

## 2020-04-27 LAB — COMPREHENSIVE METABOLIC PANEL
ALT: 21 U/L (ref 0–44)
AST: 32 U/L (ref 15–41)
Albumin: 4 g/dL (ref 3.5–5.0)
Alkaline Phosphatase: 61 U/L (ref 38–126)
Anion gap: 10 (ref 5–15)
BUN: 21 mg/dL (ref 8–23)
CO2: 25 mmol/L (ref 22–32)
Calcium: 9.4 mg/dL (ref 8.9–10.3)
Chloride: 99 mmol/L (ref 98–111)
Creatinine, Ser: 0.93 mg/dL (ref 0.44–1.00)
GFR, Estimated: 58 mL/min — ABNORMAL LOW (ref >60)
Glucose, Bld: 127 mg/dL — ABNORMAL HIGH (ref 70–99)
Potassium: 4.1 mmol/L (ref 3.5–5.1)
Sodium: 134 mmol/L — ABNORMAL LOW (ref 135–145)
Total Bilirubin: 0.9 mg/dL (ref 0.3–1.2)
Total Protein: 7.5 g/dL (ref 6.5–8.1)

## 2020-04-27 LAB — TYPE AND SCREEN
ABO/RH(D): AB POS
Antibody Screen: NEGATIVE

## 2020-04-27 LAB — PROTIME-INR
INR: 1 (ref 0.8–1.2)
Prothrombin Time: 13 seconds (ref 11.4–15.2)

## 2020-04-27 LAB — RESP PANEL BY RT-PCR (FLU A&B, COVID) ARPGX2
Influenza A by PCR: NEGATIVE
Influenza B by PCR: NEGATIVE
SARS Coronavirus 2 by RT PCR: NEGATIVE

## 2020-04-27 LAB — APTT: aPTT: 33 seconds (ref 24–36)

## 2020-04-27 SURGERY — ARTHROPLASTY, HIP, TOTAL, ANTERIOR APPROACH
Anesthesia: Spinal | Site: Hip | Laterality: Right

## 2020-04-27 MED ORDER — MAGNESIUM HYDROXIDE 400 MG/5ML PO SUSP
30.0000 mL | Freq: Every day | ORAL | Status: DC | PRN
  Administered 2020-05-03 – 2020-05-04 (×2): 30 mL via ORAL
  Filled 2020-04-27 (×2): qty 30

## 2020-04-27 MED ORDER — ONDANSETRON HCL 4 MG/2ML IJ SOLN: 4.0000 mg | Freq: Once | INTRAMUSCULAR | Status: DC | PRN

## 2020-04-27 MED ORDER — BISACODYL 10 MG RE SUPP
10.0000 mg | Freq: Every day | RECTAL | Status: DC | PRN
  Administered 2020-05-01 – 2020-05-05 (×2): 10 mg via RECTAL
  Filled 2020-04-27 (×2): qty 1

## 2020-04-27 MED ORDER — ONDANSETRON HCL 4 MG/2ML IJ SOLN
INTRAMUSCULAR | Status: DC | PRN
  Administered 2020-04-27: 4 mg via INTRAVENOUS

## 2020-04-27 MED ORDER — AMLODIPINE BESYLATE 5 MG PO TABS
5.0000 mg | ORAL_TABLET | Freq: Every day | ORAL | Status: DC
  Administered 2020-04-28 – 2020-05-06 (×9): 5 mg via ORAL
  Filled 2020-04-27 (×9): qty 1

## 2020-04-27 MED ORDER — ONDANSETRON HCL 4 MG PO TABS: 4.0000 mg | ORAL_TABLET | Freq: Four times a day (QID) | ORAL | Status: DC | PRN

## 2020-04-27 MED ORDER — MORPHINE SULFATE (PF) 4 MG/ML IV SOLN: 4.0000 mg | Freq: Once | INTRAVENOUS | Status: DC

## 2020-04-27 MED ORDER — PROPOFOL 500 MG/50ML IV EMUL
INTRAVENOUS | Status: DC | PRN
  Administered 2020-04-27: 20 ug/kg/min via INTRAVENOUS

## 2020-04-27 MED ORDER — GLYCOPYRROLATE 0.2 MG/ML IJ SOLN
INTRAMUSCULAR | Status: DC | PRN
  Administered 2020-04-27 (×2): .1 mg via INTRAVENOUS

## 2020-04-27 MED ORDER — SODIUM CHLORIDE 0.9 % IV SOLN: Freq: Once | INTRAVENOUS | Status: AC

## 2020-04-27 MED ORDER — SODIUM CHLORIDE 0.9 % IV SOLN
INTRAVENOUS | Status: DC | PRN
  Administered 2020-04-27: 50 ug/min via INTRAVENOUS

## 2020-04-27 MED ORDER — LIDOCAINE HCL (PF) 1 % IJ SOLN
INTRAMUSCULAR | Status: DC | PRN
  Administered 2020-04-27: 3 mL

## 2020-04-27 MED ORDER — MORPHINE SULFATE (PF) 2 MG/ML IV SOLN
0.5000 mg | INTRAVENOUS | Status: DC | PRN
  Administered 2020-04-29 – 2020-05-01 (×2): 0.5 mg via INTRAVENOUS
  Filled 2020-04-27 (×2): qty 1

## 2020-04-27 MED ORDER — CEFAZOLIN SODIUM-DEXTROSE 2-4 GM/100ML-% IV SOLN
2.0000 g | INTRAVENOUS | Status: AC
  Administered 2020-04-27: 2 g via INTRAVENOUS
  Filled 2020-04-27: qty 100

## 2020-04-27 MED ORDER — BUPIVACAINE HCL (PF) 0.5 % IJ SOLN
INTRAMUSCULAR | Status: DC | PRN
  Administered 2020-04-27: 2.5 mL via INTRATHECAL

## 2020-04-27 MED ORDER — METOCLOPRAMIDE HCL 5 MG/ML IJ SOLN
5.0000 mg | Freq: Three times a day (TID) | INTRAMUSCULAR | Status: DC | PRN
Start: 2020-04-27 — End: 2020-05-06

## 2020-04-27 MED ORDER — DOCUSATE SODIUM 100 MG PO CAPS
100.0000 mg | ORAL_CAPSULE | Freq: Two times a day (BID) | ORAL | Status: DC
  Administered 2020-04-27 – 2020-05-06 (×18): 100 mg via ORAL
  Filled 2020-04-27 (×19): qty 1

## 2020-04-27 MED ORDER — PROPOFOL 10 MG/ML IV BOLUS
INTRAVENOUS | Status: DC | PRN
  Administered 2020-04-27: 10 mg via INTRAVENOUS
  Administered 2020-04-27: 20 mg via INTRAVENOUS
  Administered 2020-04-27 (×2): 10 mg via INTRAVENOUS

## 2020-04-27 MED ORDER — LACTATED RINGERS IV SOLN: INTRAVENOUS | Status: DC | PRN

## 2020-04-27 MED ORDER — PROPOFOL 500 MG/50ML IV EMUL
INTRAVENOUS | Status: AC
  Filled 2020-04-27: qty 50

## 2020-04-27 MED ORDER — DORZOLAMIDE HCL-TIMOLOL MAL 2-0.5 % OP SOLN
1.0000 [drp] | Freq: Two times a day (BID) | OPHTHALMIC | Status: DC
  Administered 2020-04-27 – 2020-05-06 (×19): 1 [drp] via OPHTHALMIC
  Filled 2020-04-27 (×2): qty 10

## 2020-04-27 MED ORDER — GLYCOPYRROLATE 0.2 MG/ML IJ SOLN
INTRAMUSCULAR | Status: AC
  Filled 2020-04-27: qty 1

## 2020-04-27 MED ORDER — PHENYLEPHRINE HCL (PRESSORS) 10 MG/ML IV SOLN
INTRAVENOUS | Status: DC | PRN
  Administered 2020-04-27: 100 ug via INTRAVENOUS
  Administered 2020-04-27: 50 ug via INTRAVENOUS
  Administered 2020-04-27 (×2): 100 ug via INTRAVENOUS

## 2020-04-27 MED ORDER — SURGIRINSE WOUND IRRIGATION SYSTEM - OPTIME
TOPICAL | Status: DC | PRN
  Administered 2020-04-27: 900 mL via TOPICAL

## 2020-04-27 MED ORDER — METOCLOPRAMIDE HCL 10 MG PO TABS
5.0000 mg | ORAL_TABLET | Freq: Three times a day (TID) | ORAL | Status: DC | PRN
  Filled 2020-04-27: qty 1

## 2020-04-27 MED ORDER — CEFAZOLIN SODIUM 1 G IJ SOLR
INTRAMUSCULAR | Status: AC
  Filled 2020-04-27: qty 60

## 2020-04-27 MED ORDER — KETOROLAC TROMETHAMINE 15 MG/ML IJ SOLN
7.5000 mg | Freq: Four times a day (QID) | INTRAMUSCULAR | Status: AC
  Administered 2020-04-27 – 2020-04-28 (×4): 7.5 mg via INTRAVENOUS
  Filled 2020-04-27 (×4): qty 1

## 2020-04-27 MED ORDER — KETAMINE HCL 50 MG/5ML IJ SOSY
PREFILLED_SYRINGE | INTRAMUSCULAR | Status: AC
  Filled 2020-04-27: qty 5

## 2020-04-27 MED ORDER — LACTATED RINGERS IV SOLN: INTRAVENOUS | Status: DC

## 2020-04-27 MED ORDER — ONDANSETRON HCL 4 MG/2ML IJ SOLN
4.0000 mg | INTRAMUSCULAR | Status: AC
  Administered 2020-04-27: 4 mg via INTRAVENOUS
  Filled 2020-04-27: qty 2

## 2020-04-27 MED ORDER — TRANEXAMIC ACID 1000 MG/10ML IV SOLN
2000.0000 mg | Freq: Once | INTRAVENOUS | Status: DC
  Filled 2020-04-27: qty 20

## 2020-04-27 MED ORDER — HYDROCODONE-ACETAMINOPHEN 5-325 MG PO TABS
1.0000 | ORAL_TABLET | Freq: Four times a day (QID) | ORAL | Status: DC | PRN
  Administered 2020-04-27 – 2020-05-05 (×8): 1 via ORAL
  Filled 2020-04-27 (×9): qty 1

## 2020-04-27 MED ORDER — ACETAMINOPHEN 325 MG PO TABS
325.0000 mg | ORAL_TABLET | Freq: Four times a day (QID) | ORAL | Status: DC | PRN
  Administered 2020-05-05: 650 mg via ORAL
  Filled 2020-04-27: qty 2

## 2020-04-27 MED ORDER — MORPHINE SULFATE (PF) 2 MG/ML IV SOLN
2.0000 mg | Freq: Once | INTRAVENOUS | Status: AC
  Administered 2020-04-27: 2 mg via INTRAVENOUS
  Filled 2020-04-27: qty 1

## 2020-04-27 MED ORDER — ASPIRIN EC 81 MG PO TBEC
81.0000 mg | DELAYED_RELEASE_TABLET | Freq: Every day | ORAL | Status: DC
  Administered 2020-04-27 – 2020-04-28 (×2): 81 mg via ORAL
  Filled 2020-04-27 (×2): qty 1

## 2020-04-27 MED ORDER — ONDANSETRON HCL 4 MG/2ML IJ SOLN
4.0000 mg | Freq: Four times a day (QID) | INTRAMUSCULAR | Status: DC | PRN
  Administered 2020-04-29: 4 mg via INTRAVENOUS
  Filled 2020-04-27: qty 2

## 2020-04-27 MED ORDER — BUPIVACAINE-EPINEPHRINE (PF) 0.25% -1:200000 IJ SOLN
INTRAMUSCULAR | Status: DC | PRN
  Administered 2020-04-27 (×2): 10 mL via PERINEURAL

## 2020-04-27 MED ORDER — HALOPERIDOL LACTATE 5 MG/ML IJ SOLN
2.0000 mg | Freq: Four times a day (QID) | INTRAMUSCULAR | Status: DC | PRN
  Administered 2020-04-27 – 2020-05-05 (×3): 2 mg via INTRAMUSCULAR
  Filled 2020-04-27 (×3): qty 1

## 2020-04-27 MED ORDER — FENTANYL CITRATE (PF) 100 MCG/2ML IJ SOLN: 25.0000 ug | INTRAMUSCULAR | Status: DC | PRN

## 2020-04-27 MED ORDER — KETAMINE HCL 10 MG/ML IJ SOLN
INTRAMUSCULAR | Status: DC | PRN
  Administered 2020-04-27 (×5): 10 mg via INTRAVENOUS

## 2020-04-27 MED ORDER — LEVOTHYROXINE SODIUM 75 MCG PO TABS
75.0000 ug | ORAL_TABLET | Freq: Every day | ORAL | Status: DC
  Administered 2020-04-28 – 2020-05-06 (×9): 75 ug via ORAL
  Filled 2020-04-27 (×11): qty 1

## 2020-04-27 MED ORDER — ACETAMINOPHEN 500 MG PO TABS
500.0000 mg | ORAL_TABLET | Freq: Four times a day (QID) | ORAL | Status: AC
  Administered 2020-04-27 – 2020-04-28 (×3): 500 mg via ORAL
  Filled 2020-04-27 (×3): qty 1

## 2020-04-27 MED ORDER — CHLORHEXIDINE GLUCONATE CLOTH 2 % EX PADS
6.0000 | MEDICATED_PAD | Freq: Every day | CUTANEOUS | Status: DC
  Administered 2020-04-27 – 2020-05-06 (×8): 6 via TOPICAL

## 2020-04-27 MED ORDER — ATORVASTATIN CALCIUM 10 MG PO TABS
20.0000 mg | ORAL_TABLET | Freq: Every day | ORAL | Status: DC
  Administered 2020-04-28 – 2020-05-05 (×8): 20 mg via ORAL
  Filled 2020-04-27 (×9): qty 1

## 2020-04-27 SURGICAL SUPPLY — 48 items
BLADE SAGITTAL WIDE XTHICK NO (BLADE) ×2 IMPLANT
BRUSH SCRUB EZ  4% CHG (MISCELLANEOUS) ×1
BRUSH SCRUB EZ 4% CHG (MISCELLANEOUS) ×1 IMPLANT
CHLORAPREP W/TINT 26 (MISCELLANEOUS) ×2 IMPLANT
COVER WAND RF STERILE (DRAPES) ×2 IMPLANT
DRAPE 3/4 80X56 (DRAPES) ×2 IMPLANT
DRAPE C-ARM 42X72 X-RAY (DRAPES) ×2 IMPLANT
DRAPE STERI IOBAN 125X83 (DRAPES) IMPLANT
DRSG AQUACEL AG ADV 3.5X10 (GAUZE/BANDAGES/DRESSINGS) ×2 IMPLANT
DRSG AQUACEL AG ADV 3.5X14 (GAUZE/BANDAGES/DRESSINGS) IMPLANT
ELECT BLADE 6.5 EXT (BLADE) ×2 IMPLANT
ELECT REM PT RETURN 9FT ADLT (ELECTROSURGICAL) ×2
ELECTRODE REM PT RTRN 9FT ADLT (ELECTROSURGICAL) ×1 IMPLANT
FEM HEAD COCR 28MM 3 (Orthopedic Implant) ×2 IMPLANT
FEMORAL HEAD COCR 28MM 3 (Orthopedic Implant) ×1 IMPLANT
GAUZE XEROFORM 1X8 LF (GAUZE/BANDAGES/DRESSINGS) ×2 IMPLANT
GLOVE INDICATOR 8.0 STRL GRN (GLOVE) ×2 IMPLANT
GLOVE SURG ORTHO LTX SZ8 (GLOVE) ×4 IMPLANT
GOWN STRL REUS W/ TWL LRG LVL3 (GOWN DISPOSABLE) ×1 IMPLANT
GOWN STRL REUS W/ TWL XL LVL3 (GOWN DISPOSABLE) ×1 IMPLANT
GOWN STRL REUS W/TWL LRG LVL3 (GOWN DISPOSABLE) ×1
GOWN STRL REUS W/TWL XL LVL3 (GOWN DISPOSABLE) ×1
HEAD BIPOLAR 46MM (Hips) ×2 IMPLANT
HOOD PEEL AWAY FLYTE STAYCOOL (MISCELLANEOUS) ×6 IMPLANT
IRRIGATION SURGIPHOR STRL (IV SOLUTION) ×2 IMPLANT
IV NS 1000ML (IV SOLUTION) ×1
IV NS 1000ML BAXH (IV SOLUTION) ×1 IMPLANT
KIT PATIENT CARE HANA TABLE (KITS) ×2 IMPLANT
KIT TURNOVER CYSTO (KITS) ×2 IMPLANT
MANIFOLD NEPTUNE II (INSTRUMENTS) ×2 IMPLANT
MAT ABSORB  FLUID 56X50 GRAY (MISCELLANEOUS) ×1
MAT ABSORB FLUID 56X50 GRAY (MISCELLANEOUS) ×1 IMPLANT
NDL SAFETY ECLIPSE 18X1.5 (NEEDLE) ×2 IMPLANT
NEEDLE HYPO 18GX1.5 SHARP (NEEDLE) ×2
NEEDLE HYPO 22GX1.5 SAFETY (NEEDLE) ×2 IMPLANT
NEEDLE SPNL 20GX3.5 QUINCKE YW (NEEDLE) ×2 IMPLANT
PACK HIP PROSTHESIS (MISCELLANEOUS) ×2 IMPLANT
PADDING CAST BLEND 4X4 NS (MISCELLANEOUS) ×4 IMPLANT
PILLOW ABDUCTION MEDIUM (MISCELLANEOUS) ×2 IMPLANT
PULSAVAC PLUS IRRIG FAN TIP (DISPOSABLE) ×2
STAPLER SKIN PROX 35W (STAPLE) ×2 IMPLANT
STEM POLAR STD S6 12/14 COLLAR (Stem) ×2 IMPLANT
SUT BONE WAX W31G (SUTURE) ×2 IMPLANT
SUT DVC 2 QUILL PDO  T11 36X36 (SUTURE) ×1
SUT DVC 2 QUILL PDO T11 36X36 (SUTURE) ×1 IMPLANT
SUT VIC AB 2-0 CT1 18 (SUTURE) ×2 IMPLANT
SYR 20ML LL LF (SYRINGE) ×2 IMPLANT
TIP FAN IRRIG PULSAVAC PLUS (DISPOSABLE) ×1 IMPLANT

## 2020-04-27 NOTE — Transfer of Care (Signed)
Immediate Anesthesia Transfer of Care Note  Patient: Ashley Cantrell  Procedure(s) Performed: TOTAL HIP ARTHROPLASTY ANTERIOR APPROACH (Right Hip)  Patient Location: PACU  Anesthesia Type:Spinal  Level of Consciousness: drowsy  Airway & Oxygen Therapy: Patient Spontanous Breathing and Patient connected to face mask oxygen  Post-op Assessment: Report given to RN and Post -op Vital signs reviewed and stable  Post vital signs: Reviewed and stable  Last Vitals:  Vitals Value Taken Time  BP 98/58 04/27/20 1145  Temp    Pulse 82 04/27/20 1146  Resp 31 04/27/20 1146  SpO2 100 % 04/27/20 1146  Vitals shown include unvalidated device data.  Last Pain:  Vitals:   04/27/20 0921  TempSrc:   PainSc: 4          Complications: No complications documented.

## 2020-04-27 NOTE — Plan of Care (Signed)
  Problem: Education: Goal: Knowledge of General Education information will improve Description: Including pain rating scale, medication(s)/side effects and non-pharmacologic comfort measures Outcome: Not Progressing   Problem: Health Behavior/Discharge Planning: Goal: Ability to manage health-related needs will improve Outcome: Not Progressing   Problem: Coping: Goal: Level of anxiety will decrease Outcome: Not Progressing   Patient confused and uncooperative at change of shift.

## 2020-04-27 NOTE — Consult Note (Addendum)
Full consult note to follow. Plan right hip hemiarthroplasty this morning. NPO.    ORTHOPAEDIC CONSULTATION  REQUESTING PHYSICIAN: No att. providers found  Chief Complaint: right hip pain  HPI: Ashley Cantrell is a 87 y.o. female who complains of right hip pain. The pain is sharp in character. The pain is severe and 10/10. The pain is worse with movement and better with rest. Denies any numbness, tingling or constitutional symptoms.  Past Medical History:  Diagnosis Date  . Anxiety   . Diabetes mellitus without complication (HCC)   . Essential hypertension   . Glaucoma   . Hard of hearing    severe  . Thyroid disease    Past Surgical History:  Procedure Laterality Date  . CATARACT EXTRACTION    . HIP PINNING,CANNULATED Left 10/28/2016   Procedure: CANNULATED HIP PINNING;  Surgeon: Lyndle Herrlich, MD;  Location: [facility] ORS;  Service: Orthopedics;  Laterality: Left;  . TOTAL HIP ARTHROPLASTY Right 04/27/2020   Procedure: TOTAL HIP ARTHROPLASTY ANTERIOR APPROACH;  Surgeon: Lyndle Herrlich, MD;  Location: [facility] ORS;  Service: Orthopedics;  Laterality: Right;   Social History   Socioeconomic History  . Marital status: Married    Spouse name: Not on file  . Number of children: Not on file  . Years of education: Not on file  . Highest education level: Not on file  Occupational History  . Not on file  Tobacco Use  . Smoking status: Never Smoker  . Smokeless tobacco: Never Used  Vaping Use  . Vaping Use: Never used  Substance and Sexual Activity  . Alcohol use: No  . Drug use: Not on file  . Sexual activity: Not on file  Other Topics Concern  . Not on file  Social History Narrative   Lives with daughter who is deaf.   Social Determinants of Health   Financial Resource Strain: Not on file  Food Insecurity: Not on file  Transportation Needs: Not on file  Physical Activity: Not on file  Stress: Not on file  Social Connections: Not on file   Family History  Problem  Relation Age of Onset  . Breast cancer Neg Hx    No Known Allergies Prior to Admission medications   Medication Sig Start Date End Date Taking? Authorizing Provider  atorvastatin (LIPITOR) 20 MG tablet Take 20 mg by mouth daily.   Yes [provider]  lisinopril (ZESTRIL) 5 MG tablet Take 1 tablet by mouth daily. 03/31/20 03/31/21 Yes [provider]  acetaminophen (TYLENOL) 325 MG tablet Take 2 tablets (650 mg total) by mouth every 6 (six) hours as needed. 10/30/16   Houston Siren, MD  dorzolamide-timolol (COSOPT) 22.3-6.8 MG/ML ophthalmic solution Place 1 drop into both eyes 2 (two) times daily.    [provider]  levothyroxine (SYNTHROID, LEVOTHROID) 75 MCG tablet Take 75 mcg by mouth daily before breakfast.    [provider]   No results found.  Positive ROS: All other systems have been reviewed and were otherwise negative with the exception of those mentioned in the HPI and as above.  Physical Exam: General: Alert, no acute distress Cardiovascular: No pedal edema Respiratory: No cyanosis, no use of accessory musculature GI: No organomegaly, abdomen is soft and non-tender Skin: No lesions in the area of chief complaint Neurologic: Sensation intact distally Psychiatric: Patient is competent for consent with normal mood and affect Lymphatic: No axillary or cervical lymphadenopathy  MUSCULOSKELETAL: right hip short, externally rotated. Compartments soft. Good cap  refill. Motor and sensory intact distally.  Assessment: Right hip fracture  Plan: Right hip hemiarthroplasty.  The diagnosis, risks, benefits and alternatives to treatment are all discussed in detail with the family. Risks include but are not limited to bleeding, infection, deep vein thrombosis, pulmonary embolism, nerve or vascular injury, non-union, repeat operation, persistent pain, weakness, stiffness and death. They understand and are eager to proceed.     Lyndle Herrlich,  MD    06/11/2020 4:31 PM

## 2020-04-27 NOTE — ED Notes (Signed)
Patient transported to X-ray 

## 2020-04-27 NOTE — Progress Notes (Signed)
Progress Note    Ashley Cantrell  QZE:[id number] DOB: 1926/01/24  DOA: 04/27/2020 PCP: Medicine, [facility]      Brief Narrative:    Medical records reviewed and are as summarized below:  Ashley Cantrell is a 87 y.o. female with medical history significant for Hypothyroidism, HTN, type 2 diabetes, hearing impairment, osteoporosis with history of left hip fracture who was brought to the hospital because of right hip pain after an unwitnessed fall.  Work-up revealed closed displaced fracture of right femoral neck.  Orthopedic surgeon was consulted to assist with management.    Assessment/Plan:   Principal Problem:   Closed displaced fracture of right femoral neck (HCC) Active Problems:   Essential hypertension   Hypothyroidism   Type 2 diabetes, diet controlled (HCC)   Hearing impairment   History of fracture of left hip   Osteoporosis   Unwitnessed fall   Preoperative clearance   Closed right hip fracture (HCC)    Body mass index is 19.81 kg/m.    Closed displaced fracture right femoral neck: Plan for right hip surgery today. Continue analgesics.  Follow-up with orthopedic surgeon.  Delirium/agitation: Continue supportive care and reorientation techniques.  Comorbidities include hypertension, hypothyroidism, type II DM.  Diet Order            Diet NPO time specified  Diet effective now                    Consultants:  Orthopedic surgeon  Procedures:  Plan for right hip surgery today    Medications:    Continuous Infusions:   Anti-infectives (From admission, onward)   None             Family Communication/Anticipated D/C date and plan/Code Status   DVT prophylaxis: SCDs Start: 04/27/20 0522     Code Status: DNR  Family Communication: None Disposition Plan:    Status is: Inpatient  Remains inpatient appropriate because:Inpatient level of care appropriate due to severity of illness   Dispo: The patient is from:  Home              Anticipated d/c is to: SNF              Patient currently is not medically stable to d/c.   Difficult to place patient No           Subjective:   Interval events noted.  Patient is confused unable to provide any history.  Objective:    Vitals:   04/27/20 0430 04/27/20 0445 04/27/20 0500 04/27/20 0545  BP: (!) 153/63  (!) 155/66 (!) 156/78  Pulse: 83 85 88 92  Resp: 14 19 20 18   Temp:    (!) 97.4 F (36.3 C)  TempSrc:    Oral  SpO2: 98% 98% 96% 100%  Weight:      Height:       No data found.   Intake/Output Summary (Last 24 hours) at 04/27/2020 0754 Last data filed at 04/27/2020 06/27/2020 Gross per 24 hour  Intake --  Output 500 ml  Net -500 ml   Filed Weights   04/27/20 0114  Weight: 54 kg    Exam:  GEN: NAD SKIN: No rash EYES: EOMI ENT: MMM CV: RRR PULM: CTA B ABD: soft, ND, NT, +BS CNS: Alert but confused and agitated. EXT: No edema or tenderness        Data Reviewed:   I have personally reviewed following labs and imaging studies:  Labs: Labs show the following:   Basic Metabolic Panel: Recent Labs  Lab 04/27/20 0254  NA 134*  K 4.1  CL 99  CO2 25  GLUCOSE 127*  BUN 21  CREATININE 0.93  CALCIUM 9.4   GFR Estimated Creatinine Clearance: 32.9 mL/min (by C-G formula based on SCr of 0.93 mg/dL). Liver Function Tests: Recent Labs  Lab 04/27/20 0254  AST 32  ALT 21  ALKPHOS 61  BILITOT 0.9  PROT 7.5  ALBUMIN 4.0   No results for input(s): LIPASE, AMYLASE in the last 168 hours. No results for input(s): AMMONIA in the last 168 hours. Coagulation profile Recent Labs  Lab 04/27/20 0254  INR 1.0    CBC: Recent Labs  Lab 04/27/20 0254  WBC 7.8  NEUTROABS 5.8  HGB 13.0  HCT 38.0  MCV 92.5  PLT 261   Cardiac Enzymes: No results for input(s): CKTOTAL, CKMB, CKMBINDEX, TROPONINI in the last 168 hours. BNP (last 3 results) No results for input(s): PROBNP in the last 8760 hours. CBG: No results for  input(s): GLUCAP in the last 168 hours. D-Dimer: No results for input(s): DDIMER in the last 72 hours. Hgb A1c: No results for input(s): HGBA1C in the last 72 hours. Lipid Profile: No results for input(s): CHOL, HDL, LDLCALC, TRIG, CHOLHDL, LDLDIRECT in the last 72 hours. Thyroid function studies: No results for input(s): TSH, T4TOTAL, T3FREE, THYROIDAB in the last 72 hours.  Invalid input(s): FREET3 Anemia work up: No results for input(s): VITAMINB12, FOLATE, FERRITIN, TIBC, IRON, RETICCTPCT in the last 72 hours. Sepsis Labs: Recent Labs  Lab 04/27/20 0254  WBC 7.8    Microbiology Recent Results (from the past 240 hour(s))  Resp Panel by RT-PCR (Flu A&B, Covid) Nasopharyngeal Swab     Status: None   Collection Time: 04/27/20  2:54 AM   Specimen: Nasopharyngeal Swab; Nasopharyngeal(NP) swabs in vial transport medium  Result Value Ref Range Status   SARS Coronavirus 2 by RT PCR NEGATIVE NEGATIVE Final    Comment: (NOTE) SARS-CoV-2 target nucleic acids are NOT DETECTED.  The SARS-CoV-2 RNA is generally detectable in upper respiratory specimens during the acute phase of infection. The lowest concentration of SARS-CoV-2 viral copies this assay can detect is 138 copies/mL. A negative result does not preclude SARS-Cov-2 infection and should not be used as the sole basis for treatment or other patient management decisions. A negative result may occur with  improper specimen collection/handling, submission of specimen other than nasopharyngeal swab, presence of viral mutation(s) within the areas targeted by this assay, and inadequate number of viral copies(<138 copies/mL). A negative result must be combined with clinical observations, patient history, and epidemiological information. The expected result is Negative.  Fact Sheet for Patients:  BloggerCourse.com  Fact Sheet for Healthcare Providers:  SeriousBroker.it  This test  is no t yet approved or cleared by the [place] FDA and  has been authorized for detection and/or diagnosis of SARS-CoV-2 by FDA under an Emergency Use Authorization (EUA). This EUA will remain  in effect (meaning this test can be used) for the duration of the COVID-19 declaration under Section 564(b)(1) of the Act, 21 U.S.C.section 360bbb-3(b)(1), unless the authorization is terminated  or revoked sooner.       Influenza A by PCR NEGATIVE NEGATIVE Final   Influenza B by PCR NEGATIVE NEGATIVE Final    Comment: (NOTE) The Xpert Xpress SARS-CoV-2/FLU/RSV plus assay is intended as an aid in the diagnosis of influenza from Nasopharyngeal swab specimens and should not  be used as a sole basis for treatment. Nasal washings and aspirates are unacceptable for Xpert Xpress SARS-CoV-2/FLU/RSV testing.  Fact Sheet for Patients: BloggerCourse.com  Fact Sheet for Healthcare Providers: SeriousBroker.it  This test is not yet approved or cleared by the [place] FDA and has been authorized for detection and/or diagnosis of SARS-CoV-2 by FDA under an Emergency Use Authorization (EUA). This EUA will remain in effect (meaning this test can be used) for the duration of the COVID-19 declaration under Section 564(b)(1) of the Act, 21 U.S.C. section 360bbb-3(b)(1), unless the authorization is terminated or revoked.  Performed at [facility], [address] [place], [place] [address]     Procedures and diagnostic studies:  DG Chest [facility] 1 View  Result Date: 04/27/2020 CLINICAL DATA:  Preoperative, unwitnessed fall EXAM: PORTABLE CHEST 1 VIEW COMPARISON:  Radiograph 10/27/2016 FINDINGS: Some mild pulmonary vascular congestion with hazy opacities, fissural and septal thickening could reflect early interstitial edema. Low volumes and atelectasis are suspected as well. Stable cardiomediastinal contours with a calcified, tortuous aorta.  No pneumothorax or visible effusion. The osseous structures appear diffusely demineralized which may limit detection of small or nondisplaced fractures. No acute or worrisome osseous or soft tissue abnormalities of the chest. Stable benign macro calcifications in the breast tissues bilaterally, IMPRESSION: 1. Mild pulmonary vascular congestion. Faint hazy opacities, fissural and septal thickening could reflect early interstitial edema. 2. Low volumes and atelectasis. 3.  Aortic Atherosclerosis (ICD10-I70.0). Electronically Signed   By: Kreg Shropshire M.D.   On: 04/27/2020 03:11   DG Hip Unilat  With Pelvis 2-3 Views Right  Result Date: 04/27/2020 CLINICAL DATA:  Fall with hip pain, unwitnessed fall, right hip pain EXAM: DG HIP (WITH OR WITHOUT PELVIS) 2-3V RIGHT COMPARISON:  Radiograph 10/27/2016, fluoroscopy 10/28/2016 FINDINGS: The osseous structures appear diffusely demineralized which may limit detection of small or nondisplaced fractures. Transcervical right femoral neck fracture with mild foreshortening across the fracture line. Surrounding soft tissue swelling is present. Evidence of prior transcervical pinning of the left femur without acute hardware complication. Femoral heads remain normally located. Remaining bones of the pelvis are intact and congruent. Degenerative changes in the hips, SI joints and symphysis pubis. IMPRESSION: 1. Transcervical right femoral neck fracture with mild foreshortening across the fracture line. Surrounding soft tissue swelling. 2. No other acute fracture or traumatic osseous injury. 3. Prior transcervical pinning of the left femur without acute complication. 4. Diffuse degenerative changes throughout the pelvis and hips. Electronically Signed   By: Kreg Shropshire M.D.   On: 04/27/2020 02:25               LOS: 0 days   Milanie Rosenfield  [facility]   Pager on [email or web address]. If 7PM-7AM, please contact night-coverage at [email or web address]     04/27/2020, 7:54  AM

## 2020-04-27 NOTE — ED Provider Notes (Signed)
[facility] Emergency Department Provider Note  ____________________________________________   Event Date/Time   First MD Initiated Contact with Patient 04/27/20 0139     (approximate)  I have reviewed the triage vital signs and the nursing notes.   HISTORY  Chief Complaint Fall  Level 5 caveat: The patient's history is limited by being severely hard of hearing and what I suspect is age-related cognitive limitation.  HPI EGYPT WELCOME is a 87 y.o. female with medical history as listed below who lives with her physically limited daughter as per the patient's niece who is at bedside.  The patient presents by EMS after a fall.   Reportedly she tripped and fell and is reporting acute onset of severe hip pain which is worse with any amount of movement.  She is not complaining of any other pain although having her answer questions is very difficult due to her limited hearing.  She has a contusion to the right part of her chin but no pain in her face, head, or neck.  She has no difficulty speaking or opening her mouth.  She is not reporting any chest pain or shortness of breath.        Past Medical History:  Diagnosis Date  . Anxiety   . Diabetes mellitus without complication (HCC)   . Essential hypertension   . Glaucoma   . Hard of hearing    severe  . Thyroid disease     Patient Active Problem List   Diagnosis Date Noted  . Hearing impairment 04/27/2020  . Closed displaced fracture of right femoral neck (HCC) 04/27/2020  . History of fracture of left hip 04/27/2020  . Osteoporosis 04/27/2020  . Unwitnessed fall 04/27/2020  . Preoperative clearance 04/27/2020  . Closed right hip fracture (HCC) 04/27/2020  . Hip fracture (HCC) 10/27/2016  . Type 2 diabetes, diet controlled (HCC) 12/26/2014  . Essential hypertension 07/31/2013  . Hypothyroidism 07/31/2013    Past Surgical History:  Procedure Laterality Date  . CATARACT EXTRACTION    . HIP  PINNING,CANNULATED Left 10/28/2016   Procedure: CANNULATED HIP PINNING;  Surgeon: Lyndle Herrlich, MD;  Location: [facility] ORS;  Service: Orthopedics;  Laterality: Left;    Prior to Admission medications   Medication Sig Start Date End Date Taking? Authorizing Provider  acetaminophen (TYLENOL) 325 MG tablet Take 2 tablets (650 mg total) by mouth every 6 (six) hours as needed. 10/30/16   Houston Siren, MD  amLODipine (NORVASC) 5 MG tablet Take 5 mg by mouth daily.    [provider]  aspirin EC 81 MG tablet Take 81 mg by mouth daily.    [provider]  atorvastatin (LIPITOR) 20 MG tablet Take 20 mg by mouth daily.    [provider]  cephALEXin (KEFLEX) 500 MG capsule Take 1 capsule (500 mg total) by mouth 3 (three) times daily. 11/26/17   Sharman Cheek, MD  dorzolamide-timolol (COSOPT) 22.3-6.8 MG/ML ophthalmic solution Place 1 drop into both eyes 2 (two) times daily.    [provider]  enoxaparin (LOVENOX) 40 MG/0.4ML injection Inject 0.4 mLs (40 mg total) into the skin daily. 10/30/16 11/13/16  Houston Siren, MD  HYDROcodone-acetaminophen (NORCO/VICODIN) 5-325 MG tablet Take 1-2 tablets by mouth every 4 (four) hours as needed (breakthrough pain). 10/30/16   Houston Siren, MD  levothyroxine (SYNTHROID, LEVOTHROID) 75 MCG tablet Take 75 mcg by mouth daily before breakfast.    [provider]  ondansetron (ZOFRAN ODT) 4 MG  disintegrating tablet Take 1 tablet (4 mg total) by mouth every 8 (eight) hours as needed for nausea or vomiting. 11/26/17   Sharman Cheek, MD  sulfamethoxazole-trimethoprim (BACTRIM DS) 800-160 MG tablet Take 1 tablet by mouth 2 (two) times daily. 11/26/17   Sharman Cheek, MD    Allergies Patient has no known allergies.  Family History  Problem Relation Age of Onset  . Breast cancer Neg Hx     Social History Social History   Tobacco Use  . Smoking status: Never Smoker  . Smokeless tobacco: Never Used  Vaping Use   . Vaping Use: Never used  Substance Use Topics  . Alcohol use: No    Review of Systems Level 5 caveat: The patient's history is limited by being severely hard of hearing and what I suspect is age-related cognitive limitation.   ____________________________________________   PHYSICAL EXAM:  VITAL SIGNS: ED Triage Vitals [04/27/20 0114]  Enc Vitals Group     BP (!) 176/93     Pulse Rate 86     Resp 16     Temp 99.2 F (37.3 C)     Temp Source Oral     SpO2 100 %     Weight 54 kg (119 lb 0.8 oz)     Height 1.651 m (5\' 5" )     Head Circumference      Peak Flow      Pain Score      Pain Loc      Pain Edu?      Excl. in GC?     Constitutional: She is awake and alert and very verbal.  No acute distress unless she moves her right leg. Eyes: Conjunctivae are normal.  Head: Patient has a bruise to the right lower part of her chin that is slightly tender to palpation.  She has no other focal tenderness and no indication of other traumatic injury to her head or face. Nose: No congestion/rhinnorhea.  No epistaxis. Mouth/Throat: Patient is wearing a mask. Neck: No stridor.  No meningeal signs.   Cardiovascular: Normal rate, regular rhythm. Good peripheral circulation. Respiratory: Normal respiratory effort.  No retractions. Gastrointestinal: Soft and nontender. No distention.  Musculoskeletal: Right leg is externally rotated and slightly shortened.  Pain and tenderness with palpation of the proximal femur and any movement of the hip consistent with fracture. Neurologic: Severe hearing deficit, normal (though high-volume) speech and language.  No focal neurological deficits are appreciated. Skin:  Skin is warm, dry and intact.  ____________________________________________   LABS (all labs ordered are listed, but only abnormal results are displayed)  Labs Reviewed  COMPREHENSIVE METABOLIC PANEL - Abnormal; Notable for the following components:      Result Value   Sodium 134  (*)    Glucose, Bld 127 (*)    GFR, Estimated 58 (*)    All other components within normal limits  CBC WITH DIFFERENTIAL/PLATELET - Abnormal; Notable for the following components:   RDW 15.7 (*)    All other components within normal limits  RESP PANEL BY RT-PCR (FLU A&B, COVID) ARPGX2  APTT  PROTIME-INR  TYPE AND SCREEN   ____________________________________________  EKG  ED ECG REPORT I, , the attending physician, personally viewed and interpreted this ECG.  Date: 04/27/2020 EKG Time: 3:09 AM Rate: 89 Rhythm: normal sinus rhythm QRS Axis: normal Intervals: normal ST/T Wave abnormalities: Non-specific ST segment / T-wave changes, but no clear evidence of acute ischemia. Narrative Interpretation: no definitive evidence of acute ischemia;  does not meet STEMI criteria.  Heavy artifact is present on EKG making precise interpretation somewhat difficult.   ____________________________________________  RADIOLOGY I, Loleta Rose, personally viewed and evaluated these images (plain radiographs) as part of my medical decision making, as well as reviewing the written report by the radiologist.  ED MD interpretation: Right transcervical right femoral neck fracture with some foreshortening.  There was a question about opacities or edema on chest x-ray but I do not believe that correlates clinically.  Official radiology report(s): DG Chest Port 1 View  Result Date: 04/27/2020 CLINICAL DATA:  Preoperative, unwitnessed fall EXAM: PORTABLE CHEST 1 VIEW COMPARISON:  Radiograph 10/27/2016 FINDINGS: Some mild pulmonary vascular congestion with hazy opacities, fissural and septal thickening could reflect early interstitial edema. Low volumes and atelectasis are suspected as well. Stable cardiomediastinal contours with a calcified, tortuous aorta. No pneumothorax or visible effusion. The osseous structures appear diffusely demineralized which may limit detection of small or nondisplaced  fractures. No acute or worrisome osseous or soft tissue abnormalities of the chest. Stable benign macro calcifications in the breast tissues bilaterally, IMPRESSION: 1. Mild pulmonary vascular congestion. Faint hazy opacities, fissural and septal thickening could reflect early interstitial edema. 2. Low volumes and atelectasis. 3.  Aortic Atherosclerosis (ICD10-I70.0). Electronically Signed   By: Kreg Shropshire M.D.   On: 04/27/2020 03:11   DG Hip Unilat  With Pelvis 2-3 Views Right  Result Date: 04/27/2020 CLINICAL DATA:  Fall with hip pain, unwitnessed fall, right hip pain EXAM: DG HIP (WITH OR WITHOUT PELVIS) 2-3V RIGHT COMPARISON:  Radiograph 10/27/2016, fluoroscopy 10/28/2016 FINDINGS: The osseous structures appear diffusely demineralized which may limit detection of small or nondisplaced fractures. Transcervical right femoral neck fracture with mild foreshortening across the fracture line. Surrounding soft tissue swelling is present. Evidence of prior transcervical pinning of the left femur without acute hardware complication. Femoral heads remain normally located. Remaining bones of the pelvis are intact and congruent. Degenerative changes in the hips, SI joints and symphysis pubis. IMPRESSION: 1. Transcervical right femoral neck fracture with mild foreshortening across the fracture line. Surrounding soft tissue swelling. 2. No other acute fracture or traumatic osseous injury. 3. Prior transcervical pinning of the left femur without acute complication. 4. Diffuse degenerative changes throughout the pelvis and hips. Electronically Signed   By: Kreg Shropshire M.D.   On: 04/27/2020 02:25    ____________________________________________   PROCEDURES   Procedure(s) performed (including Critical Care):  Procedures   ____________________________________________   INITIAL IMPRESSION / MDM / ASSESSMENT AND PLAN / ED COURSE  As part of my medical decision making, I reviewed the following data within  the electronic MEDICAL RECORD NUMBER History obtained from family, Nursing notes reviewed and incorporated, Labs reviewed , EKG interpreted , Old chart reviewed, Radiograph reviewed , Discussed with admitting physician (Dr. Para March), Discussed with orthopedic surgeon (Dr. Franco Collet) and reviewed Notes from prior ED visits   Differential diagnosis includes, but is not limited to, hip fracture, hip dislocation, musculoskeletal strain, intracranial injury or facial trauma, cervical spine injury, electrolyte or metabolic abnormality.  I suspect the patient has a femur fracture.  Radiographs are pending.       Clinical Course as of 04/27/20 8250  Sun Apr 27, 2020  0307 I personally reviewed the patient's imaging and agree with the radiologist's interpretation that there is a right femoral neck fracture.  I updated the patient's niece at the bedside and the patient.  Lab work is pending.  I discussed the case by phone with  Dr. Franco ColletNappo with orthopedics and he will pass along the word to Dr. Odis LusterBowers in the morning.  I am giving morphine 2 mg IV for pain control and keeping the patient n.p.o. with normal saline 100 mL/h infusion. [CF]  0315 [facility] I personally reviewed the patient's chest x-ray and clinically it does not match with an infiltrate or pulmonary edema.  The patient is lying completely supine with no respiratory distress.  I think that the changes seen on her chest x-ray may be chronic. [CF]  0342 CBC WITH DIFFERENTIAL(!) Essentially normal and reassuring CBC [CF]  0345 Comprehensive metabolic panel(!) Reassuring comprehensive metabolic panel. [CF]  0345 Consulting hospitalist for admission. [CF]  [phone] Discussed case with Dr. Para Marchuncan who will admit [CF]    Clinical Course User Index [CF] Loleta RoseForbach, Jury Caserta, MD     ____________________________________________  FINAL CLINICAL IMPRESSION(S) / ED DIAGNOSES  Final diagnoses:  Closed displaced fracture of right femoral neck (HCC)      MEDICATIONS GIVEN DURING THIS VISIT:  Medications  0.9 %  sodium chloride infusion ( Intravenous New Bag/Given 04/27/20 0323)  morphine 2 MG/ML injection 2 mg (2 mg Intravenous Given 04/27/20 0321)  ondansetron (ZOFRAN) injection 4 mg (4 mg Intravenous Given 04/27/20 0319)     ED Discharge Orders    None      *Please note:  Myrtha MantisMae L Stirling was evaluated in Emergency Department on 04/27/2020 for the symptoms described in the history of present illness. She was evaluated in the context of the global COVID-19 pandemic, which necessitated consideration that the patient might be at risk for infection with the SARS-CoV-2 virus that causes COVID-19. Institutional protocols and algorithms that pertain to the evaluation of patients at risk for COVID-19 are in a state of rapid change based on information released by regulatory bodies including the CDC and federal and state organizations. These policies and algorithms were followed during the patient's care in the ED.  Some ED evaluations and interventions may be delayed as a result of limited staffing during and after the pandemic.*  Note:  This document was prepared using Dragon voice recognition software and may include unintentional dictation errors.   Loleta RoseForbach, Kiela Shisler, MD 04/27/20 [phone]

## 2020-04-27 NOTE — Plan of Care (Signed)

## 2020-04-27 NOTE — H&P (Signed)
History and Physical    Ashley Cantrell ZOX:[id number] DOB: 25-Jan-1926 DOA: 04/27/2020  PCP[facility]   Patient coming from: home  I [facility] personally briefly [facility]  Chief Complaint: fall  HPI: Ashley MantisMae L Briere is a 87 y.o. female with medical history significant for Hypothyroidism, HTN, type 2 diabetes, hearing impairment, osteoporosis with history of left hip fracture who presents by EMS following an  unwitnessed fall sustaining injury to the right hip and right chin.  Patient was previously in her usual state of health.  Most of history provided by her niece at the bedside through a video relay service as niece at bedside is deaf. ED course: On arrival, temperature at 99.2, BP 176/93, pulse 86 O2 sat 100% on room air.  CBC and BMP unremarkable.  Covid and flu pending EKG as interpreted by me: Sinus rhythm at 90 with nonspecific ST-T wave changes Chest x-ray: Mild pulmonary vascular congestion Could reflect early interstitial edema Right hip x-ray: Transcervical right femoral neck fracture with surrounding edema  The emergency room provider spoke with orthopedist Dr. Franco ColletNappo.  Hospitalist consulted for admission.  Review of Systems: Limited due to hearing impairment   Past Medical History:  Diagnosis Date   Anxiety    Diabetes mellitus without complication (HCC)    Essential hypertension    Glaucoma    Hard of hearing    severe   Thyroid disease     Past Surgical History:  Procedure Laterality Date   CATARACT EXTRACTION     HIP PINNING,CANNULATED Left 10/28/2016   Procedure: CANNULATED HIP PINNING;  Surgeon: Lyndle HerrlichBowers, James R, MD;  Location: [facility] ORS;  Service: Orthopedics;  Laterality: Left;     reports that she has never smoked. She has never used smokeless tobacco. She reports that she does not drink alcohol. No history on file for drug use.  No Known Allergies  Family History  Problem Relation Age  of Onset   Breast cancer Neg Hx       Prior to Admission medications   Medication Sig Start Date End Date Taking? Authorizing Provider  acetaminophen (TYLENOL) 325 MG tablet Take 2 tablets (650 mg total) by mouth every 6 (six) hours as needed. 10/30/16   Houston SirenSainani, Vivek J, MD  amLODipine (NORVASC) 5 MG tablet Take 5 mg by mouth daily.    [provider]  aspirin EC 81 MG tablet Take 81 mg by mouth daily.    [provider]  atorvastatin (LIPITOR) 20 MG tablet Take 20 mg by mouth daily.    [provider]  cephALEXin (KEFLEX) 500 MG capsule Take 1 capsule (500 mg total) by mouth 3 (three) times daily. 11/26/17   Sharman CheekStafford, Phillip, MD  dorzolamide-timolol (COSOPT) 22.3-6.8 MG/ML ophthalmic solution Place 1 drop into both eyes 2 (two) times daily.    [provider]  enoxaparin (LOVENOX) 40 MG/0.4ML injection Inject 0.4 mLs (40 mg total) into the skin daily. 10/30/16 11/13/16  Houston SirenSainani, Vivek J, MD  HYDROcodone-acetaminophen (NORCO/VICODIN) 5-325 MG tablet Take 1-2 tablets by mouth every 4 (four) hours as needed (breakthrough pain). 10/30/16   Houston SirenSainani, Vivek J, MD  levothyroxine (SYNTHROID, LEVOTHROID) 75 MCG tablet Take 75 mcg by mouth daily before breakfast.    [provider]  ondansetron (ZOFRAN ODT) 4 MG disintegrating tablet Take 1 tablet (4 mg total) by mouth every 8 (eight) hours as needed for nausea or vomiting. 11/26/17   Sharman CheekStafford, Phillip, MD  sulfamethoxazole-trimethoprim (  BACTRIM DS) 800-160 MG tablet Take 1 tablet by mouth 2 (two) times daily. 11/26/17   Sharman Cheek, MD    Physical Exam: Vitals:   04/27/20 0245 04/27/20 0300 04/27/20 0315 04/27/20 0330  BP:  (!) 173/86    Pulse: 88 87 84 80  Resp:   19 16  Temp:      TempSrc:      SpO2: 100% 100% 98% 98%  Weight:      Height:         Vitals:   04/27/20 0245 04/27/20 0300 04/27/20 0315 04/27/20 0330  BP:  (!) 173/86    Pulse: 88 87 84 80  Resp:   19 16  Temp:      TempSrc:       SpO2: 100% 100% 98% 98%  Weight:      Height:          Constitutional: Alert and oriented x 3 .  Pain discomfort with movement of the leg.  Very hearing impaired HEENT:      Head: Normocephalic, bruise right lower chin, tender on palpation without bony stepoffs       Eyes: PERLA, EOMI, Conjunctivae are normal. Sclera is non-icteric.       Mouth/Throat: Mucous membranes are moist.       Neck: Supple with no signs of meningismus. Cardiovascular: Regular rate and rhythm. No murmurs, gallops, or rubs. 2+ symmetrical distal pulses are present . No JVD. No LE edema Respiratory: Respiratory effort normal .Lungs sounds clear bilaterally. No wheezes, crackles, or rhonchi.  Gastrointestinal: Soft, non tender, and non distended with positive bowel sounds.  Genitourinary: No CVA tenderness. Musculoskeletal: Right leg externally rotated and shortened with tenderness on palpation and with any movement. Neurologic:  Face is symmetric. Moving all extremities. No gross focal neurologic deficits . Skin: Skin is warm, dry.  No rash or ulcers Psychiatric: Mood and affect are normal    Labs on Admission: I have personally reviewed following labs and imaging studies  CBC: Recent Labs  Lab 04/27/20 0254  WBC 7.8  NEUTROABS 5.8  HGB 13.0  HCT 38.0  MCV 92.5  PLT 261   Basic Metabolic Panel: No results for input(s): NA, K, CL, CO2, GLUCOSE, BUN, CREATININE, CALCIUM, MG, PHOS in the last 168 hours. GFR: CrCl cannot be calculated (Patient's most recent lab result is older than the maximum 21 days allowed.). Liver Function Tests: No results for input(s): AST, ALT, ALKPHOS, BILITOT, PROT, ALBUMIN in the last 168 hours. No results for input(s): LIPASE, AMYLASE in the last 168 hours. No results for input(s): AMMONIA in the last 168 hours. Coagulation Profile: No results for input(s): INR, PROTIME in the last 168 hours. Cardiac Enzymes: No results for input(s): CKTOTAL, CKMB, CKMBINDEX, TROPONINI in  the last 168 hours. BNP (last 3 results) No results for input(s): PROBNP in the last 8760 hours. HbA1C: No results for input(s): HGBA1C in the last 72 hours. CBG: No results for input(s): GLUCAP in the last 168 hours. Lipid Profile: No results for input(s): CHOL, HDL, LDLCALC, TRIG, CHOLHDL, LDLDIRECT in the last 72 hours. Thyroid Function Tests: No results for input(s): TSH, T4TOTAL, FREET4, T3FREE, THYROIDAB in the last 72 hours. Anemia Panel: No results for input(s): VITAMINB12, FOLATE, FERRITIN, TIBC, IRON, RETICCTPCT in the last 72 hours. Urine analysis: No results found for: COLORURINE, APPEARANCEUR, LABSPEC, PHURINE, GLUCOSEU, HGBUR, BILIRUBINUR, KETONESUR, PROTEINUR, UROBILINOGEN, NITRITE, LEUKOCYTESUR  Radiological Exams on Admission: DG Chest Port 1 View  Result Date: 04/27/2020 CLINICAL DATA:  Preoperative, unwitnessed fall EXAM: PORTABLE CHEST 1 VIEW COMPARISON:  Radiograph 10/27/2016 FINDINGS: Some mild pulmonary vascular congestion with hazy opacities, fissural and septal thickening could reflect early interstitial edema. Low volumes and atelectasis are suspected as well. Stable cardiomediastinal contours with a calcified, tortuous aorta. No pneumothorax or visible effusion. The osseous structures appear diffusely demineralized which may limit detection of small or nondisplaced fractures. No acute or worrisome osseous or soft tissue abnormalities of the chest. Stable benign macro calcifications in the breast tissues bilaterally, IMPRESSION: 1. Mild pulmonary vascular congestion. Faint hazy opacities, fissural and septal thickening could reflect early interstitial edema. 2. Low volumes and atelectasis. 3.  Aortic Atherosclerosis (ICD10-I70.0). Electronically Signed   By: Kreg Shropshire M.D.   On: 04/27/2020 03:11   DG Hip Unilat  With Pelvis 2-3 Views Right  Result Date: 04/27/2020 CLINICAL DATA:  Fall with hip pain, unwitnessed fall, right hip pain EXAM: DG HIP (WITH OR WITHOUT  PELVIS) 2-3V RIGHT COMPARISON:  Radiograph 10/27/2016, fluoroscopy 10/28/2016 FINDINGS: The osseous structures appear diffusely demineralized which may limit detection of small or nondisplaced fractures. Transcervical right femoral neck fracture with mild foreshortening across the fracture line. Surrounding soft tissue swelling is present. Evidence of prior transcervical pinning of the left femur without acute hardware complication. Femoral heads remain normally located. Remaining bones of the pelvis are intact and congruent. Degenerative changes in the hips, SI joints and symphysis pubis. IMPRESSION: 1. Transcervical right femoral neck fracture with mild foreshortening across the fracture line. Surrounding soft tissue swelling. 2. No other acute fracture or traumatic osseous injury. 3. Prior transcervical pinning of the left femur without acute complication. 4. Diffuse degenerative changes throughout the pelvis and hips. Electronically Signed   By: Kreg Shropshire M.D.   On: 04/27/2020 02:25     Assessment/Plan 87 year old female with history of hypothyroidism, HTN, type 2 diabetes, hearing impairment, osteoporosis with history of left hip fracture who presents by EMS following an  unwitnessed fall sustaining injury to the right hip and right chin.  Patient was previously in her usual state of health.  Most of history provided by her niece at the bedside through a video relay service as niece at bedside is deaf.    Closed displaced fracture of right femoral neck (HCC)   Unwitnessed fall    Osteoporosis and history of left hip fracture   Preoperative clearance -Patient with an unwitnessed fall who was previously in her usual state of health -N.p.o. from midnight -Pain control -Further orders per orthopedist -Chest x-ray showed mild pulmonary vascular congestion but patient not short of breath or clinically volume overloaded -Moderate risk of perioperative cardiopulmonary complications in view of chest  x-ray findings so spinal can be considered    Essential hypertension -Continue home meds    Hypothyroidism -Continue home levothyroxine    Type 2 diabetes, diet controlled (HCC) -Sliding scale insulin coverage    Hearing impairment -Increase nursing assistance with communication  DVT prophylaxis: scd Code Status: DNR Family Communication:  Niece via video relay service Disposition Plan: Back to previous home environment Consults called:ortho Status:At the time of admission, it appears that the appropriate admission status for this patient is INPATIENT. This is judged to be reasonable and necessary in order to provide the required intensity of service to ensure the patient's safety given the presenting symptoms, physical exam findings, and initial radiographic and laboratory data in the context of their  Comorbid conditions.   Patient requires inpatient status due to high intensity of service, high  risk for further deterioration and high frequency of surveillance required.   I certify that at the point of admission it is my clinical judgment that the patient will require inpatient hospital care spanning beyond 2 midnights     Andris Baumann MD [facility]     04/27/2020, 3:34 AM

## 2020-04-27 NOTE — ED Triage Notes (Signed)
Pt BIB EMS for unwitnessed fall at home, pt complaint of R hip pain. No deformity noted at this time, pt was ambulatory for EMS/fire. Pt in NAD at this time.

## 2020-04-27 NOTE — Anesthesia Procedure Notes (Addendum)
Spinal  Patient location during procedure: OR Start time: 04/27/2020 10:01 AM End time: 04/27/2020 10:03 AM Staffing Performed: resident/CRNA  Anesthesiologist: Martha Clan, MD Resident/CRNA: Norm Salt, CRNA Preanesthetic Checklist Completed: patient identified, IV checked, site marked, risks and benefits discussed, surgical consent, monitors and equipment checked, pre-op evaluation and timeout performed Spinal Block Patient position: left lateral decubitus Prep: DuraPrep Patient monitoring: heart rate, cardiac monitor, continuous pulse ox and blood pressure Approach: midline Location: L3-4 Injection technique: single-shot Needle Needle type: Sprotte  Needle gauge: 24 G Needle length: 9 cm Additional Notes IV functioning, monitors applied to pt. Expiration date of kit checked and confirmed to be in date. Sterile prep and drape, hand hygiene and sterile gloved used. Pt was positioned and spine was prepped in sterile fashion. Skin was anesthetized with lidocaine. Free flow of clear CSF obtained prior to injecting local anesthetic into CSF x 1 attempt. Spinal needle aspirated freely following injection. Needle was carefully withdrawn, and pt tolerated procedure well. Loss of motor and sensory on exam post injection.

## 2020-04-27 NOTE — Anesthesia Preprocedure Evaluation (Signed)
Anesthesia Evaluation  Patient identified by MRN, date of birth, ID band Patient awake    Reviewed: Allergy & Precautions, H&P , NPO status , Patient's Chart, lab work & pertinent test results, reviewed documented beta blocker date and time   History of Anesthesia Complications Negative for: history of anesthetic complications  Airway Mallampati: II  TM Distance: >3 FB Neck ROM: full    Dental  (+) Edentulous Upper, Edentulous Lower, Teeth Intact   Pulmonary neg pulmonary ROS, neg recent URI,    Pulmonary exam normal        Cardiovascular Exercise Tolerance: Good hypertension, (-) angina(-) Past MI and (-) Cardiac Stents Normal cardiovascular exam(-) dysrhythmias (-) Valvular Problems/Murmurs Rhythm:regular Rate:Normal     Neuro/Psych PSYCHIATRIC DISORDERS (confused) Dementia negative neurological ROS     GI/Hepatic negative GI ROS, Neg liver ROS,   Endo/Other  diabetesHypothyroidism   Renal/GU negative Renal ROS  negative genitourinary   Musculoskeletal   Abdominal   Peds  Hematology negative hematology ROS (+)   Anesthesia Other Findings Past Medical History: No date: Diabetes mellitus without complication (HCC) No date: Thyroid disease Past Surgical History: No date: CATARACT EXTRACTION BMI    Body Mass Index:  19.79 kg/m     Reproductive/Obstetrics negative OB ROS                             Anesthesia Physical  Anesthesia Plan  ASA: II  Anesthesia Plan: Spinal   Post-op Pain Management:    Induction:   PONV Risk Score and Plan: 4 or greater and Ondansetron, Dexamethasone, Propofol infusion and TIVA  Airway Management Planned: Natural Airway and Simple Face Mask  Additional Equipment:   Intra-op Plan:   Post-operative Plan:   Informed Consent: I have reviewed the patients History and Physical, chart, labs and discussed the procedure including the risks, benefits  and alternatives for the proposed anesthesia with the patient or authorized representative who has indicated his/her understanding and acceptance.     Dental Advisory Given  Plan Discussed with: CRNA  Anesthesia Plan Comments:         Anesthesia Quick Evaluation

## 2020-04-27 NOTE — Anesthesia Procedure Notes (Signed)
Procedure Name: MAC Date/Time: 04/27/2020 9:53 AM Performed by: Lily Peer, Kelse Ploch, CRNA Pre-anesthesia Checklist: Patient identified, Emergency Drugs available, Suction available and Patient being monitored Patient Re-evaluated:Patient Re-evaluated prior to induction Oxygen Delivery Method: Simple face mask Induction Type: IV induction

## 2020-04-27 NOTE — Op Note (Signed)
04/27/2020  11:53 AM  PATIENT:  Ashley Cantrell   MRN: [id number]  PRE-OPERATIVE DIAGNOSIS:  Displaced Subcapital fracture right hip   POST-OPERATIVE DIAGNOSIS: Same  Procedure: Right Hip Anterior Hip Hemiarthroplasty   Surgeon: Dola Argyle. Odis Luster, MD   Assist: None  Anesthesia: Spinal   EBL: 100 mL   Specimens: None   Drains: None   Components used: A size 6 Polarstem [employer], a 46 mm bipolar head    Description of the procedure in detail: After informed consent was obtained and the appropriate extremity marked in the pre-operative holding area, the patient was taken to the operating room and placed in the supine position on the fracture table. All pressure points were well padded and bilateral lower extremities were place in traction spars. The hip was prepped and draped in standard sterile fashion. A spinal anesthetic had been delivered by the anesthesia team. The skin and subcutaneous tissues were injected with a mixture of Marcaine with epinephrine for post-operative pain. A longitudinal incision approximately 10 cm in length was carried out from the anterior superior iliac spine to the greater trochanter. The tensor fascia was divided and blunt dissection was taken down to the level of the joint capsule. The lateral circumflex vessels were cauterized. Deep retractors were placed and a portion of the anterior capsule was excised. Using fluoroscopy the neck cut was planned and carried out with a sagittal saw. The head was passed from the field with use of a corkscrew and hip skid. Deep retractors were placed along the acetabulum and bony and soft tissue debris was removed.   Attention was then turned to the proximal femur. The leg was placed in extension and external rotation. The canal was opened and sequentially broached to a size 6. The trial components were placed and the hip relocated. The components were found to be in good position using fluoroscopy. The hip was dislocated  and the trial components removed. The final components were impacted in to position and the hip relocated. The final components were again check with fluoroscopy and found to be in good position. Hemostasis was achieved with electrocautery. The deep capsule was injected with Marcaine and epinephrine. The wound was irrigated with bacitracin laced normal saline and the tensor fascia closed with #2 Quill suture. The subcutaneous tissues were closed with 2-0 vicryl and staples for the skin. A sterile dressing was applied and an abduction pillow. Patient tolerated the procedure well and there were no apparent complication. Patient was taken to the recovery room in good condition.    Dola Argyle. Odis Luster, MD  04/27/2020 11:53 AM

## 2020-04-27 NOTE — ED Notes (Signed)
Secure chat sent to IP nurse, Leander Rams.

## 2020-04-28 ENCOUNTER — Encounter: Payer: Self-pay | Admitting: Orthopedic Surgery

## 2020-04-28 DIAGNOSIS — S72001A Fracture of unspecified part of neck of right femur, initial encounter for closed fracture: Secondary | ICD-10-CM | POA: Diagnosis not present

## 2020-04-28 LAB — CBC WITH DIFFERENTIAL/PLATELET
Abs Immature Granulocytes: 0.01 10*3/uL (ref 0.00–0.07)
Basophils Absolute: 0 10*3/uL (ref 0.0–0.1)
Basophils Relative: 0 %
Eosinophils Absolute: 0 10*3/uL (ref 0.0–0.5)
Eosinophils Relative: 0 %
HCT: 29 % — ABNORMAL LOW (ref 36.0–46.0)
Hemoglobin: 9.8 g/dL — ABNORMAL LOW (ref 12.0–15.0)
Immature Granulocytes: 0 %
Lymphocytes Relative: 24 %
Lymphs Abs: 1.2 10*3/uL (ref 0.7–4.0)
MCH: 31.4 pg (ref 26.0–34.0)
MCHC: 33.8 g/dL (ref 30.0–36.0)
MCV: 92.9 fL (ref 80.0–100.0)
Monocytes Absolute: 0.6 10*3/uL (ref 0.1–1.0)
Monocytes Relative: 11 %
Neutro Abs: 3.3 10*3/uL (ref 1.7–7.7)
Neutrophils Relative %: 65 %
Platelets: 181 10*3/uL (ref 150–400)
RBC: 3.12 MIL/uL — ABNORMAL LOW (ref 3.87–5.11)
RDW: 15.2 % (ref 11.5–15.5)
WBC: 5 10*3/uL (ref 4.0–10.5)
nRBC: 0 % (ref 0.0–0.2)

## 2020-04-28 LAB — BASIC METABOLIC PANEL
Anion gap: 5 (ref 5–15)
BUN: 20 mg/dL (ref 8–23)
CO2: 25 mmol/L (ref 22–32)
Calcium: 8.1 mg/dL — ABNORMAL LOW (ref 8.9–10.3)
Chloride: 102 mmol/L (ref 98–111)
Creatinine, Ser: 0.89 mg/dL (ref 0.44–1.00)
GFR, Estimated: >60 mL/min (ref >60)
Glucose, Bld: 93 mg/dL (ref 70–99)
Potassium: 4.2 mmol/L (ref 3.5–5.1)
Sodium: 132 mmol/L — ABNORMAL LOW (ref 135–145)

## 2020-04-28 NOTE — Anesthesia Postprocedure Evaluation (Signed)
Anesthesia Post Note  Patient: Ashley Cantrell  Procedure(s) Performed: TOTAL HIP ARTHROPLASTY ANTERIOR APPROACH (Right Hip)  Patient location during evaluation: Nursing Unit Anesthesia Type: Spinal Level of consciousness: oriented and awake and alert Pain management: pain level controlled Vital Signs Assessment: post-procedure vital signs reviewed and stable Respiratory status: spontaneous breathing and respiratory function stable Cardiovascular status: blood pressure returned to baseline and stable Postop Assessment: no headache, no backache, no apparent nausea or vomiting and patient able to bend at knees Anesthetic complications: no   No complications documented.   Last Vitals:  Vitals:   04/27/20 2329 04/28/20 0507  BP: (!) 149/66 121/73  Pulse: 79 79  Resp:  18  Temp:  36.7 C  SpO2:  100%    Last Pain:  Vitals:   04/28/20 0507  TempSrc: Oral  PainSc:                  Starling Manns

## 2020-04-28 NOTE — Progress Notes (Signed)
Initial Nutrition Assessment  DOCUMENTATION CODES:   Not applicable  INTERVENTION:  Ensure Enlive po BID, each supplement provides 350 kcal and 20 grams of protein    NUTRITION DIAGNOSIS:   Increased nutrient needs related to post-op healing,hip fracture as evidenced by estimated needs.    GOAL:   Patient will meet greater than or equal to 90% of their needs    MONITOR:   PO intake,Supplement acceptance,Weight trends,Labs,I & O's,Skin  REASON FOR ASSESSMENT:   Consult Assessment of nutrition requirement/status (hip fx)  ASSESSMENT:  87 year female admitted with closed displaced fracture of right femoral neck following unwitnessed fall. Past medical history of hypothyroidism, HTN, DM2, severe hearing impairment, and osteoporosis.  Pt awake this morning, RN at bedside assisting pt with breakfast tray. Pt is very hard of hearing, confused, unable to obtain history. Pt pointing to pancakes and sausage, stating she already ate dinner. RN repeatedly explained to patient it was morning and it was breakfast time. RN reports pt continues to be upset about eating dinner tray in bed last night, unsure of amount of dinner consumed. Pt with increased needs to support post-operative healing, will order Ensure to to help her meet her needs.   Per encounters weights appear stable over the 10 months. Pt weighed 52.3 kg at office visit on 02/04/20 and 52.2 kg at office visit on 08/15/19.  I/Os: -761.6 ml since admit UOP: 2150 ml since admit Medications reviewed and include: Colace IVF: LR @ 75 ml/hr  Labs: Na 132 (L), Hgb 9.8 (L), HCT 29 (L)  NUTRITION - FOCUSED PHYSICAL EXAM: Unable to complete at this time d/t AMS, will complete at follow-up as able   Diet Order:   Diet Order            Diet regular Room service appropriate? Yes; Fluid consistency: Thin  Diet effective now                 EDUCATION NEEDS:   No education needs have been identified at this time  Skin:   Skin Assessment: Skin Integrity Issues: Skin Integrity Issues:: Incisions Incisions: closed; right hip  Last BM:  pta  Height:   Ht Readings from Last 1 Encounters:  04/27/20 5\' 5"  (1.651 m)    Weight:   Wt Readings from Last 1 Encounters:  04/27/20 54 kg    BMI:  Body mass index is 19.81 kg/m.  Estimated Nutritional Needs:   Kcal:  1400-1600  Protein:  70-85  Fluid:  1.3 L   06/27/20, RD, LDN Clinical Nutrition After Hours/Weekend Pager # in Amion

## 2020-04-28 NOTE — Progress Notes (Addendum)
Progress Note    Ashley Cantrell  QVZ:[id number] DOB: 06-Dec-1925  DOA: 04/27/2020 PCP: [facility], [facility]      Brief Narrative:    Medical records reviewed and are as summarized below:  Ashley Cantrell is a 87 y.o. female with medical history significant for Hypothyroidism, HTN, type 2 diabetes, chronic hyponatremia, hearing impairment, osteoporosis with history of left hip fracture who was brought to the hospital because of right hip pain after an unwitnessed fall.  Work-up revealed closed displaced fracture of right femoral neck.  Orthopedic surgeon was consulted to assist with management.    Assessment/Plan:   Principal Problem:   Closed displaced fracture of right femoral neck (HCC) Active Problems:   Essential hypertension   Hypothyroidism   Type 2 diabetes, diet controlled (HCC)   Hearing impairment   History of fracture of left hip   Osteoporosis   Unwitnessed fall   Preoperative clearance   Closed right hip fracture (HCC)    Body mass index is 19.81 kg/m.    Closed displaced fracture right femoral neck: s/p right anterior hip hemiarthroplasty.  Analgesics as needed for pain.  Laxatives as needed for constipation.  Follow-up with orthopedic surgeon.  Acute blood loss anemia: No indication for blood transfusion at this time.  Monitor H&H.  Delirium/agitation: Haldol as needed for agitation.  Continue supportive care.  Comorbidities include hypertension, hypothyroidism, type II DM.  Diet Order            Diet regular Room service appropriate? Yes; Fluid consistency: Thin  Diet effective now                    Consultants:  Orthopedic surgeon  Procedures:  Plan for right hip surgery today    Medications:   . amLODipine  5 mg Oral Daily  . aspirin EC  81 mg Oral Daily  . atorvastatin  20 mg Oral Daily  . Chlorhexidine Gluconate Cloth  6 each Topical Daily  . docusate sodium  100 mg Oral BID  . dorzolamide-timolol  1 drop Both  Eyes BID  . levothyroxine  75 mcg Oral QAC breakfast  . tranexamic acid (CYKLOKAPRON) topical - INTRAOP  2,000 mg Topical Once   Continuous Infusions: . lactated ringers 75 mL/hr at 04/28/20 0647     Anti-infectives (From admission, onward)   Start     Dose/Rate Route Frequency Ordered Stop   04/27/20 0828  ceFAZolin (ANCEF) IVPB 2g/100 mL premix        2 g 200 mL/hr over 30 Minutes Intravenous 30 min pre-op 04/27/20 0829 04/27/20 1023             Family Communication/Anticipated D/C date and plan/Code Status   DVT prophylaxis: SCDs Start: 04/27/20 1242 SCDs Start: 04/27/20 0522     Code Status: DNR  Family Communication: None Disposition Plan:    Status is: Inpatient  Remains inpatient appropriate because:Inpatient level of care appropriate due to severity of illness   Dispo: The patient is from: Home              Anticipated d/c is to: SNF              Patient currently is not medically stable to d/c.   Difficult to place patient No           Subjective:   Interval events noted.  Patient is confused unable to provide any history.   Objective:    Vitals:  04/27/20 2329 04/28/20 0507 04/28/20 0823 04/28/20 1156  BP: (!) 149/66 121/73 131/66 (!) 116/55  Pulse: 79 79 73 71  Resp:  18 18 18   Temp:  98 F (36.7 C) 99.2 F (37.3 C) 98.4 F (36.9 C)  TempSrc:  Oral  Oral  SpO2:  100% 100% 100%  Weight:      Height:       No data found.   Intake/Output Summary (Last 24 hours) at 04/28/2020 1430 Last data filed at 04/28/2020 1353 Gross per 24 hour  Intake 1188.36 ml  Output 1650 ml  Net -461.64 ml   Filed Weights   04/27/20 0114  Weight: 54 kg    Exam:  GEN: NAD SKIN: Warm and dry EYES: No acute abnormality noted ENT: MMM CV: RRR PULM: CTA B ABD: soft, ND, NT, +BS CNS: Sleepy but arousable EXT: Right hip tenderness.  Dressing on right hip surgical wound is intact        Data Reviewed:   I have personally reviewed  following labs and imaging studies:  Labs: Labs show the following:   Basic Metabolic Panel: Recent Labs  Lab 04/27/20 0254 04/28/20 0838  NA 134* 132*  K 4.1 4.2  CL 99 102  CO2 25 25  GLUCOSE 127* 93  BUN 21 20  CREATININE 0.93 0.89  CALCIUM 9.4 8.1*   GFR Estimated Creatinine Clearance: 34.4 mL/min (by C-G formula based on SCr of 0.89 mg/dL). Liver Function Tests: Recent Labs  Lab 04/27/20 0254  AST 32  ALT 21  ALKPHOS 61  BILITOT 0.9  PROT 7.5  ALBUMIN 4.0   No results for input(s): LIPASE, AMYLASE in the last 168 hours. No results for input(s): AMMONIA in the last 168 hours. Coagulation profile Recent Labs  Lab 04/27/20 0254  INR 1.0    CBC: Recent Labs  Lab 04/27/20 0254 04/28/20 0838  WBC 7.8 5.0  NEUTROABS 5.8 3.3  HGB 13.0 9.8*  HCT 38.0 29.0*  MCV 92.5 92.9  PLT 261 181   Cardiac Enzymes: No results for input(s): CKTOTAL, CKMB, CKMBINDEX, TROPONINI in the last 168 hours. BNP (last 3 results) No results for input(s): PROBNP in the last 8760 hours. CBG: No results for input(s): GLUCAP in the last 168 hours. D-Dimer: No results for input(s): DDIMER in the last 72 hours. Hgb A1c: No results for input(s): HGBA1C in the last 72 hours. Lipid Profile: No results for input(s): CHOL, HDL, LDLCALC, TRIG, CHOLHDL, LDLDIRECT in the last 72 hours. Thyroid function studies: No results for input(s): TSH, T4TOTAL, T3FREE, THYROIDAB in the last 72 hours.  Invalid input(s): FREET3 Anemia work up: No results for input(s): VITAMINB12, FOLATE, FERRITIN, TIBC, IRON, RETICCTPCT in the last 72 hours. Sepsis Labs: Recent Labs  Lab 04/27/20 0254 04/28/20 0838  WBC 7.8 5.0    Microbiology Recent Results (from the past 240 hour(s))  Resp Panel by RT-PCR (Flu A&B, Covid) Nasopharyngeal Swab     Status: None   Collection Time: 04/27/20  2:54 AM   Specimen: Nasopharyngeal Swab; Nasopharyngeal(NP) swabs in vial transport medium  Result Value Ref Range  Status   SARS Coronavirus 2 by RT PCR NEGATIVE NEGATIVE Final    Comment: (NOTE) SARS-CoV-2 target nucleic acids are NOT DETECTED.  The SARS-CoV-2 RNA is generally detectable in upper respiratory specimens during the acute phase of infection. The lowest concentration of SARS-CoV-2 viral copies this assay can detect is 138 copies/mL. A negative result does not preclude SARS-Cov-2 infection and should not be  used as the sole basis for treatment or other patient management decisions. A negative result may occur with  improper specimen collection/handling, submission of specimen other than nasopharyngeal swab, presence of viral mutation(s) within the areas targeted by this assay, and inadequate number of viral copies(<138 copies/mL). A negative result must be combined with clinical observations, patient history, and epidemiological information. The expected result is Negative.  Fact Sheet for Patients:  BloggerCourse.comhttps://www.fda.gov/media/152166/download  Fact Sheet for Healthcare Providers:  SeriousBroker.ithttps://www.fda.gov/media/152162/download  This test is no t yet approved or cleared by the [place] FDA and  has been authorized for detection and/or diagnosis of SARS-CoV-2 by FDA under an Emergency Use Authorization (EUA). This EUA will remain  in effect (meaning this test can be used) for the duration of the COVID-19 declaration under Section 564(b)(1) of the Act, 21 U.S.C.section 360bbb-3(b)(1), unless the authorization is terminated  or revoked sooner.       Influenza A by PCR NEGATIVE NEGATIVE Final   Influenza B by PCR NEGATIVE NEGATIVE Final    Comment: (NOTE) The Xpert Xpress SARS-CoV-2/FLU/RSV plus assay is intended as an aid in the diagnosis of influenza from Nasopharyngeal swab specimens and should not be used as a sole basis for treatment. Nasal washings and aspirates are unacceptable for Xpert Xpress SARS-CoV-2/FLU/RSV testing.  Fact Sheet for  Patients: BloggerCourse.comhttps://www.fda.gov/media/152166/download  Fact Sheet for Healthcare Providers: SeriousBroker.ithttps://www.fda.gov/media/152162/download  This test is not yet approved or cleared by the [place] FDA and has been authorized for detection and/or diagnosis of SARS-CoV-2 by FDA under an Emergency Use Authorization (EUA). This EUA will remain in effect (meaning this test can be used) for the duration of the COVID-19 declaration under Section 564(b)(1) of the Act, 21 U.S.C. section 360bbb-3(b)(1), unless the authorization is terminated or revoked.  Performed at [facility], [address] [place], [place] [address]     Procedures and diagnostic studies:  [facility]  Result Date: 04/27/2020 CLINICAL DATA:  Preoperative, unwitnessed fall EXAM: PORTABLE CHEST 1 VIEW COMPARISON:  Radiograph 10/27/2016 FINDINGS: Some mild pulmonary vascular congestion with hazy opacities, fissural and septal thickening could reflect early interstitial edema. Low volumes and atelectasis are suspected as well. Stable cardiomediastinal contours with a calcified, tortuous aorta. No pneumothorax or visible effusion. The osseous structures appear diffusely demineralized which may limit detection of small or nondisplaced fractures. No acute or worrisome osseous or soft tissue abnormalities of the chest. Stable benign macro calcifications in the breast tissues bilaterally, IMPRESSION: 1. Mild pulmonary vascular congestion. Faint hazy opacities, fissural and septal thickening could reflect early interstitial edema. 2. Low volumes and atelectasis. 3.  Aortic Atherosclerosis (ICD10-I70.0). Electronically Signed   By: Kreg ShropshirePrice  DeHay M.D.   On: 04/27/2020 03:11   DG HIP OPERATIVE UNILAT W OR W/O PELVIS RIGHT  Result Date: 04/27/2020 CLINICAL DATA:  RIGHT hip hemiarthroplasty EXAM: OPERATIVE RIGHT HIP (WITH PELVIS IF PERFORMED) 1 VIEWS TECHNIQUE: Fluoroscopic spot image(s) were submitted for interpretation  post-operatively. COMPARISON:  Prior studies FINDINGS: RIGHT hip hemiarthroplasty identified without gross complicating features. IMPRESSION: RIGHT hip hemiarthroplasty without gross complicating features. Electronically Signed   By: Harmon PierJeffrey  Hu M.D.   On: 04/27/2020 13:22   DG Hip Unilat  With Pelvis 2-3 Views Right  Result Date: 04/27/2020 CLINICAL DATA:  Fall with hip pain, unwitnessed fall, right hip pain EXAM: DG HIP (WITH OR WITHOUT PELVIS) 2-3V RIGHT COMPARISON:  Radiograph 10/27/2016, fluoroscopy 10/28/2016 FINDINGS: The osseous structures appear diffusely demineralized which may limit detection of small or nondisplaced  fractures. Transcervical right femoral neck fracture with mild foreshortening across the fracture line. Surrounding soft tissue swelling is present. Evidence of prior transcervical pinning of the left femur without acute hardware complication. Femoral heads remain normally located. Remaining bones of the pelvis are intact and congruent. Degenerative changes in the hips, SI joints and symphysis pubis. IMPRESSION: 1. Transcervical right femoral neck fracture with mild foreshortening across the fracture line. Surrounding soft tissue swelling. 2. No other acute fracture or traumatic osseous injury. 3. Prior transcervical pinning of the left femur without acute complication. 4. Diffuse degenerative changes throughout the pelvis and hips. Electronically Signed   By: Kreg Shropshire M.D.   On: 04/27/2020 02:25               LOS: 1 day   Ashley Cantrell  [facility]   Pager on [email or web address]. If 7PM-7AM, please contact night-coverage at [email or web address]     04/28/2020, 2:30 PM

## 2020-04-28 NOTE — Progress Notes (Signed)
Subjective:  Patient reports pain as mild.  Patient hard of hearing and daughter is deaf. Sign language interpreter in room.  Objective:   VITALS:   Vitals:   04/27/20 2329 04/28/20 0507 04/28/20 0823 04/28/20 1156  BP: (!) 149/66 121/73 131/66 (!) 116/55  Pulse: 79 79 73 71  Resp:  18 18 18   Temp:  98 F (36.7 C) 99.2 F (37.3 C) 98.4 F (36.9 C)  TempSrc:  Oral  Oral  SpO2:  100% 100% 100%  Weight:      Height:        PHYSICAL EXAM:  Neurologically intact ABD soft Neurovascular intact Sensation intact distally Intact pulses distally Dorsiflexion/Plantar flexion intact Incision: scant drainage No cellulitis present Compartment soft  LABS  Results for orders placed or performed during the hospital encounter of 04/27/20 (from the past 24 hour(s))  CBC with Differential/Platelet     Status: Abnormal   Collection Time: 04/28/20  8:38 AM  Result Value Ref Range   WBC 5.0 4.0 - 10.5 K/uL   RBC 3.12 (L) 3.87 - 5.11 MIL/uL   Hemoglobin 9.8 (L) 12.0 - 15.0 g/dL   HCT 06/28/20 (L) 63.8 - 46.6 %   MCV 92.9 80.0 - 100.0 fL   MCH 31.4 26.0 - 34.0 pg   MCHC 33.8 30.0 - 36.0 g/dL   RDW 59.9 35.7 - 01.7 %   Platelets 181 150 - 400 K/uL   nRBC 0.0 0.0 - 0.2 %   Neutrophils Relative % 65 %   Neutro Abs 3.3 1.7 - 7.7 K/uL   Lymphocytes Relative 24 %   Lymphs Abs 1.2 0.7 - 4.0 K/uL   Monocytes Relative 11 %   Monocytes Absolute 0.6 0.1 - 1.0 K/uL   Eosinophils Relative 0 %   Eosinophils Absolute 0.0 0.0 - 0.5 K/uL   Basophils Relative 0 %   Basophils Absolute 0.0 0.0 - 0.1 K/uL   Immature Granulocytes 0 %   Abs Immature Granulocytes 0.01 0.00 - 0.07 K/uL  Basic metabolic panel     Status: Abnormal   Collection Time: 04/28/20  8:38 AM  Result Value Ref Range   Sodium 132 (L) 135 - 145 mmol/L   Potassium 4.2 3.5 - 5.1 mmol/L   Chloride 102 98 - 111 mmol/L   CO2 25 22 - 32 mmol/L   Glucose, Bld 93 70 - 99 mg/dL   BUN 20 8 - 23 mg/dL   Creatinine, Ser 06/28/20 0.44 - 1.00  mg/dL   Calcium 8.1 (L) 8.9 - 10.3 mg/dL   GFR, Estimated 9.03 >00 mL/min   Anion gap 5 5 - 15    DG Chest Port 1 View  Result Date: 04/27/2020 CLINICAL DATA:  Preoperative, unwitnessed fall EXAM: PORTABLE CHEST 1 VIEW COMPARISON:  Radiograph 10/27/2016 FINDINGS: Some mild pulmonary vascular congestion with hazy opacities, fissural and septal thickening could reflect early interstitial edema. Low volumes and atelectasis are suspected as well. Stable cardiomediastinal contours with a calcified, tortuous aorta. No pneumothorax or visible effusion. The osseous structures appear diffusely demineralized which may limit detection of small or nondisplaced fractures. No acute or worrisome osseous or soft tissue abnormalities of the chest. Stable benign macro calcifications in the breast tissues bilaterally, IMPRESSION: 1. Mild pulmonary vascular congestion. Faint hazy opacities, fissural and septal thickening could reflect early interstitial edema. 2. Low volumes and atelectasis. 3.  Aortic Atherosclerosis (ICD10-I70.0). Electronically Signed   By: 12/27/2016 M.D.   On: 04/27/2020 03:11   DG HIP  OPERATIVE UNILAT W OR W/O PELVIS RIGHT  Result Date: 04/27/2020 CLINICAL DATA:  RIGHT hip hemiarthroplasty EXAM: OPERATIVE RIGHT HIP (WITH PELVIS IF PERFORMED) 1 VIEWS TECHNIQUE: Fluoroscopic spot image(s) were submitted for interpretation post-operatively. COMPARISON:  Prior studies FINDINGS: RIGHT hip hemiarthroplasty identified without gross complicating features. IMPRESSION: RIGHT hip hemiarthroplasty without gross complicating features. Electronically Signed   By: Harmon Pier M.D.   On: 04/27/2020 13:22   DG Hip Unilat  With Pelvis 2-3 Views Right  Result Date: 04/27/2020 CLINICAL DATA:  Fall with hip pain, unwitnessed fall, right hip pain EXAM: DG HIP (WITH OR WITHOUT PELVIS) 2-3V RIGHT COMPARISON:  Radiograph 10/27/2016, fluoroscopy 10/28/2016 FINDINGS: The osseous structures appear diffusely demineralized which  may limit detection of small or nondisplaced fractures. Transcervical right femoral neck fracture with mild foreshortening across the fracture line. Surrounding soft tissue swelling is present. Evidence of prior transcervical pinning of the left femur without acute hardware complication. Femoral heads remain normally located. Remaining bones of the pelvis are intact and congruent. Degenerative changes in the hips, SI joints and symphysis pubis. IMPRESSION: 1. Transcervical right femoral neck fracture with mild foreshortening across the fracture line. Surrounding soft tissue swelling. 2. No other acute fracture or traumatic osseous injury. 3. Prior transcervical pinning of the left femur without acute complication. 4. Diffuse degenerative changes throughout the pelvis and hips. Electronically Signed   By: Kreg Shropshire M.D.   On: 04/27/2020 02:25    Assessment/Plan: 1 Day Post-Op   Principal Problem:   Closed displaced fracture of right femoral neck (HCC) Active Problems:   Essential hypertension   Hypothyroidism   Type 2 diabetes, diet controlled (HCC)   Hearing impairment   History of fracture of left hip   Osteoporosis   Unwitnessed fall   Preoperative clearance   Closed right hip fracture (HCC)   Advance diet Up with therapy  Aspirin 81 mg twice daily for 30 days D/C per hospitialist Hemoglobin stable Follow up in 2 weeks for staple removal in Dr. Maxcine Ham office [phone]    Altamese Cabal , PA-C 04/28/2020, 1:08 PM

## 2020-04-28 NOTE — Progress Notes (Signed)
PT Cancellation Note  Patient Details Name: Ashley Cantrell MRN: [id number] DOB: February 09, 1926   Cancelled Treatment:     Therapist chart reviewed and attempted to see pt.  Pt is currently disoriented and unable to recall name, DOB, place, or time.  Nursing notified and assisted therapist in repositioning pt towards HOB.  Pt will be attempted to be seen at later time/date as medically appropriate with improved mentation.    Nolon Bussing, PT, DPT 04/28/20, 10:51 AM

## 2020-04-29 DIAGNOSIS — S72001A Fracture of unspecified part of neck of right femur, initial encounter for closed fracture: Secondary | ICD-10-CM | POA: Diagnosis not present

## 2020-04-29 LAB — CBC WITH DIFFERENTIAL/PLATELET
Abs Immature Granulocytes: 0.03 10*3/uL (ref 0.00–0.07)
Basophils Absolute: 0 10*3/uL (ref 0.0–0.1)
Basophils Relative: 0 %
Eosinophils Absolute: 0 10*3/uL (ref 0.0–0.5)
Eosinophils Relative: 0 %
HCT: 33 % — ABNORMAL LOW (ref 36.0–46.0)
Hemoglobin: 11.6 g/dL — ABNORMAL LOW (ref 12.0–15.0)
Immature Granulocytes: 1 %
Lymphocytes Relative: 16 %
Lymphs Abs: 1 10*3/uL (ref 0.7–4.0)
MCH: 32.1 pg (ref 26.0–34.0)
MCHC: 35.2 g/dL (ref 30.0–36.0)
MCV: 91.4 fL (ref 80.0–100.0)
Monocytes Absolute: 0.8 10*3/uL (ref 0.1–1.0)
Monocytes Relative: 12 %
Neutro Abs: 4.6 10*3/uL (ref 1.7–7.7)
Neutrophils Relative %: 71 %
Platelets: 233 10*3/uL (ref 150–400)
RBC: 3.61 MIL/uL — ABNORMAL LOW (ref 3.87–5.11)
RDW: 15.5 % (ref 11.5–15.5)
WBC: 6.4 10*3/uL (ref 4.0–10.5)
nRBC: 0 % (ref 0.0–0.2)

## 2020-04-29 LAB — BASIC METABOLIC PANEL
Anion gap: 7 (ref 5–15)
BUN: 20 mg/dL (ref 8–23)
CO2: 25 mmol/L (ref 22–32)
Calcium: 8.5 mg/dL — ABNORMAL LOW (ref 8.9–10.3)
Chloride: 100 mmol/L (ref 98–111)
Creatinine, Ser: 0.89 mg/dL (ref 0.44–1.00)
GFR, Estimated: >60 mL/min (ref >60)
Glucose, Bld: 122 mg/dL — ABNORMAL HIGH (ref 70–99)
Potassium: 3.8 mmol/L (ref 3.5–5.1)
Sodium: 132 mmol/L — ABNORMAL LOW (ref 135–145)

## 2020-04-29 LAB — SURGICAL PATHOLOGY

## 2020-04-29 MED ORDER — ASPIRIN EC 81 MG PO TBEC
81.0000 mg | DELAYED_RELEASE_TABLET | Freq: Two times a day (BID) | ORAL | Status: DC
  Administered 2020-04-29 – 2020-05-06 (×15): 81 mg via ORAL
  Filled 2020-04-29 (×15): qty 1

## 2020-04-29 NOTE — NC FL2 (Signed)
[place] MEDICAID FL2 LEVEL OF CARE SCREENING TOOL     IDENTIFICATION  Patient Name: Ashley Cantrell Birthdate: Mar 29, 1925 Sex: female Admission Date (Current Location): 04/27/2020  [place] Number:  Chiropodist and Address:  [facility], [address] [place], [place] [address]      Provider Number: [id number]  Attending Physician Name and Address:  Lurene Shadow, MD  Relative Name and Phone Number:  Avayah, Raffety (Daughter)   [phone] [facility] Phone)    Current Level of Care: Hospital Recommended Level of Care: Skilled Nursing Facility Prior Approval Number:    Date Approved/Denied:   PASRR Number: [id number]  Discharge Plan: SNF    Current Diagnoses: Patient Active Problem List   Diagnosis Date Noted  . Hearing impairment 04/27/2020  . Closed displaced fracture of right femoral neck (HCC) 04/27/2020  . History of fracture of left hip 04/27/2020  . Osteoporosis 04/27/2020  . Unwitnessed fall 04/27/2020  . Preoperative clearance 04/27/2020  . Closed right hip fracture (HCC) 04/27/2020  . Hip fracture (HCC) 10/27/2016  . Type 2 diabetes, diet controlled (HCC) 12/26/2014  . Essential hypertension 07/31/2013  . Hypothyroidism 07/31/2013    Orientation RESPIRATION BLADDER Height & Weight     Self  Normal Indwelling catheter Weight: 54 kg Height:  5\' 5"  (165.1 cm)  BEHAVIORAL SYMPTOMS/MOOD NEUROLOGICAL BOWEL NUTRITION STATUS  Wanderer   Continent Diet (Regular)  AMBULATORY STATUS COMMUNICATION OF NEEDS Skin   Limited Assist   Surgical wounds (R Hip surgical wound with impregnated guaze)                       Personal Care Assistance Level of Assistance  Bathing,Feeding,Dressing Bathing Assistance: Limited assistance (Per daughter report) Feeding assistance: Limited assistance Dressing Assistance: Limited assistance     Functional Limitations Info  Sight,Hearing,Speech Sight Info: Adequate Hearing  Info: Impaired Speech Info: Adequate    SPECIAL CARE FACTORS FREQUENCY  PT (By licensed PT),OT (By licensed OT)                    Contractures Contractures Info: Not present    Additional Factors Info  Code Status Code Status Info: DNR             Current Medications (04/29/2020):  This is the current hospital active medication list Current Facility-Administered Medications  Medication Dose Route Frequency Provider Last Rate Last Admin  . acetaminophen (TYLENOL) tablet 325-650 mg  325-650 mg Oral Q6H PRN 06/29/2020, MD      . amLODipine (NORVASC) tablet 5 mg  5 mg Oral Daily Lyndle Herrlich, MD   5 mg at 04/29/20 06/29/20  . aspirin EC tablet 81 mg  81 mg Oral BID 7654, PA-C   81 mg at 04/29/20 06/29/20  . atorvastatin (LIPITOR) tablet 20 mg  20 mg Oral Daily 6503, MD   20 mg at 04/28/20 2114  . bisacodyl (DULCOLAX) suppository 10 mg  10 mg Rectal Daily PRN 2115, MD      . Chlorhexidine Gluconate Cloth 2 % PADS 6 each  6 each Topical Daily Lyndle Herrlich, MD   6 each at 04/29/20 [phone]  . docusate sodium (COLACE) capsule 100 mg  100 mg Oral BID 5465, MD   100 mg at 04/29/20 06/29/20  . dorzolamide-timolol (COSOPT) 22.3-6.8 MG/ML ophthalmic solution 1 drop  1 drop Both Eyes BID 6812, MD   1  drop at 04/29/20 0853  . haloperidol lactate (HALDOL) injection 2 mg  2 mg Intramuscular Q6H PRN Lurene Shadow, MD   2 mg at 04/27/20 1855  . HYDROcodone-acetaminophen (NORCO/VICODIN) 5-325 MG per tablet 1-2 tablet  1-2 tablet Oral Q6H PRN Lyndle Herrlich, MD   1 tablet at 04/28/20 1530  . levothyroxine (SYNTHROID) tablet 75 mcg  75 mcg Oral QAC breakfast Lyndle Herrlich, MD   75 mcg at 04/29/20 0547  . magnesium hydroxide (MILK OF MAGNESIA) suspension 30 mL  30 mL Oral Daily PRN Lyndle Herrlich, MD      . metoCLOPramide (REGLAN) tablet 5-10 mg  5-10 mg Oral Q8H PRN Lyndle Herrlich, MD       Or  . metoCLOPramide (REGLAN) injection 5-10 mg  5-10  mg Intravenous Q8H PRN Lyndle Herrlich, MD      . morphine 2 MG/ML injection 0.5 mg  0.5 mg Intravenous Q2H PRN Lyndle Herrlich, MD   0.5 mg at 04/29/20 0051  . ondansetron (ZOFRAN) tablet 4 mg  4 mg Oral Q6H PRN Lyndle Herrlich, MD       Or  . ondansetron [facility]) injection 4 mg  4 mg Intravenous Q6H PRN Lyndle Herrlich, MD   4 mg at 04/29/20 0051  . tranexamic acid (CYKLOKAPRON) 2,000 mg in sodium chloride 0.9 % 50 mL Topical Application  2,000 mg Topical Once Lyndle Herrlich, MD         Discharge Medications: Please see discharge summary for a list of discharge medications.  Relevant Imaging Results:  Relevant Lab Results:   Additional Information    Caryn Section, RN

## 2020-04-29 NOTE — Progress Notes (Signed)
Subjective:  Patient reports pain as mild.    Objective:   VITALS:   Vitals:   04/28/20 1527 04/28/20 2117 04/29/20 0052 04/29/20 0543  BP: 128/65 (!) 142/69 (!) 156/60 (!) 146/76  Pulse: 73 85 84 84  Resp: 16 16 16 16   Temp: 98 F (36.7 C) 98 F (36.7 C) 98.6 F (37 C) 98.9 F (37.2 C)  TempSrc: Oral     SpO2: 94% 99% 97% 98%  Weight:      Height:        PHYSICAL EXAM:  Neurologically intact ABD soft Neurovascular intact Sensation intact distally Intact pulses distally Dorsiflexion/Plantar flexion intact Incision: scant drainage and dressing changed No cellulitis present Compartment soft  LABS  Results for orders placed or performed during the hospital encounter of 04/27/20 (from the past 24 hour(s))  CBC with Differential/Platelet     Status: Abnormal   Collection Time: 04/28/20  8:38 AM  Result Value Ref Range   WBC 5.0 4.0 - 10.5 K/uL   RBC 3.12 (L) 3.87 - 5.11 MIL/uL   Hemoglobin 9.8 (L) 12.0 - 15.0 g/dL   HCT 06/28/20 (L) 49.7 - 02.6 %   MCV 92.9 80.0 - 100.0 fL   MCH 31.4 26.0 - 34.0 pg   MCHC 33.8 30.0 - 36.0 g/dL   RDW 37.8 58.8 - 50.2 %   Platelets 181 150 - 400 K/uL   nRBC 0.0 0.0 - 0.2 %   Neutrophils Relative % 65 %   Neutro Abs 3.3 1.7 - 7.7 K/uL   Lymphocytes Relative 24 %   Lymphs Abs 1.2 0.7 - 4.0 K/uL   Monocytes Relative 11 %   Monocytes Absolute 0.6 0.1 - 1.0 K/uL   Eosinophils Relative 0 %   Eosinophils Absolute 0.0 0.0 - 0.5 K/uL   Basophils Relative 0 %   Basophils Absolute 0.0 0.0 - 0.1 K/uL   Immature Granulocytes 0 %   Abs Immature Granulocytes 0.01 0.00 - 0.07 K/uL  Basic metabolic panel     Status: Abnormal   Collection Time: 04/28/20  8:38 AM  Result Value Ref Range   Sodium 132 (L) 135 - 145 mmol/L   Potassium 4.2 3.5 - 5.1 mmol/L   Chloride 102 98 - 111 mmol/L   CO2 25 22 - 32 mmol/L   Glucose, Bld 93 70 - 99 mg/dL   BUN 20 8 - 23 mg/dL   Creatinine, Ser 06/28/20 0.44 - 1.00 mg/dL   Calcium 8.1 (L) 8.9 - 10.3 mg/dL    GFR, Estimated 1.28 >78 mL/min   Anion gap 5 5 - 15    DG HIP OPERATIVE UNILAT W OR W/O PELVIS RIGHT  Result Date: 04/27/2020 CLINICAL DATA:  RIGHT hip hemiarthroplasty EXAM: OPERATIVE RIGHT HIP (WITH PELVIS IF PERFORMED) 1 VIEWS TECHNIQUE: Fluoroscopic spot image(s) were submitted for interpretation post-operatively. COMPARISON:  Prior studies FINDINGS: RIGHT hip hemiarthroplasty identified without gross complicating features. IMPRESSION: RIGHT hip hemiarthroplasty without gross complicating features. Electronically Signed   By: 06/27/2020 M.D.   On: 04/27/2020 13:22    Assessment/Plan: 2 Days Post-Op   Principal Problem:   Closed displaced fracture of right femoral neck (HCC) Active Problems:   Essential hypertension   Hypothyroidism   Type 2 diabetes, diet controlled (HCC)   Hearing impairment   History of fracture of left hip   Osteoporosis   Unwitnessed fall   Preoperative clearance   Closed right hip fracture (HCC)   Advance diet Up with therapy  Aspirin  81 mg twice daily for 30 days D/C per hospitialist Hemoglobin stable Follow up in 2 weeks for staple removal in Dr. Maxcine Ham office [phone]  Altamese Cabal , PA-C 04/29/2020, 7:56 AM

## 2020-04-29 NOTE — Progress Notes (Addendum)
Progress Note    Ashley Cantrell  GXQ:[id number] DOB: December 07, 1925  DOA: 04/27/2020 PCP: Medicine, [facility]      Brief Narrative:    Medical records reviewed and are as summarized below:  Ashley Cantrell is a 87 y.o. female with medical history significant for Hypothyroidism, HTN, type 2 diabetes, chronic hyponatremia, hearing impairment, osteoporosis with history of left hip fracture who was brought to the hospital because of right hip pain after an unwitnessed fall.  Work-up revealed closed displaced fracture of right femoral neck.  Orthopedic surgeon was consulted to assist with management.    Assessment/Plan:   Principal Problem:   Closed displaced fracture of right femoral neck (HCC) Active Problems:   Essential hypertension   Hypothyroidism   Type 2 diabetes, diet controlled (HCC)   Hearing impairment   History of fracture of left hip   Osteoporosis   Unwitnessed fall   Preoperative clearance   Closed right hip fracture (HCC)    Body mass index is 19.81 kg/m.    Closed displaced fracture right femoral neck: s/p right anterior hip hemiarthroplasty on 04/27/2020.  Continue analgesics as needed for pain.  Orthopedic surgeon recommended low-dose aspirin twice a day for 30 days for DVT prophylaxis.  Laxatives as needed for constipation.  PT and OT recommended discharge to SNF.  Acute blood loss anemia: H&H is stable.  No indication for blood transfusion.    Delirium/agitation: Haldol as needed for agitation.  Continue supportive care.  Comorbidities include hypertension, hypothyroidism, type II DM.  Awaiting placement to SNF  Diet Order            Diet regular Room service appropriate? Yes; Fluid consistency: Thin  Diet effective now                    Consultants:  Orthopedic surgeon  Procedures:  S/p right anterior hip hemiarthroplasty    Medications:   . amLODipine  5 mg Oral Daily  . aspirin EC  81 mg Oral BID  . atorvastatin   20 mg Oral Daily  . Chlorhexidine Gluconate Cloth  6 each Topical Daily  . docusate sodium  100 mg Oral BID  . dorzolamide-timolol  1 drop Both Eyes BID  . levothyroxine  75 mcg Oral QAC breakfast  . tranexamic acid (CYKLOKAPRON) topical - INTRAOP  2,000 mg Topical Once   Continuous Infusions:    Anti-infectives (From admission, onward)   Start     Dose/Rate Route Frequency Ordered Stop   04/27/20 0828  ceFAZolin (ANCEF) IVPB 2g/100 mL premix        2 g 200 mL/hr over 30 Minutes Intravenous 30 min pre-op 04/27/20 0829 04/27/20 1023             Family Communication/Anticipated D/C date and plan/Code Status   DVT prophylaxis: SCDs Start: 04/27/20 1242 SCDs Start: 04/27/20 0522     Code Status: DNR  Family Communication: Plan discussed with her niece, Ms. Leanne Chang. Disposition Plan:    Status is: Inpatient  Remains inpatient appropriate because:Inpatient level of care appropriate due to severity of illness   Dispo: The patient is from: Home              Anticipated d/c is to: SNF              Patient currently is not medically stable to d/c.   Difficult to place patient No  Subjective:   Interval events noted.  She is confused and unable to provide any history.  Objective:    Vitals:   04/29/20 0052 04/29/20 0543 04/29/20 0845 04/29/20 1155  BP: (!) 156/60 (!) 146/76 (!) 114/57 (!) 127/51  Pulse: 84 84 83 79  Resp: 16 16 16 16   Temp: 98.6 F (37 C) 98.9 F (37.2 C) 98.1 F (36.7 C) 98.2 F (36.8 C)  TempSrc:   Oral Oral  SpO2: 97% 98% 97% 100%  Weight:      Height:       No data found.   Intake/Output Summary (Last 24 hours) at 04/29/2020 1252 Last data filed at 04/29/2020 0543 Gross per 24 hour  Intake 0 ml  Output 950 ml  Net -950 ml   Filed Weights   04/27/20 0114  Weight: 54 kg    Exam:  GEN: NAD SKIN: Warm and dry   EYES: No pallor or icterus ENT: MMM CV: RRR PULM: CTA B ABD: soft, ND, NT, +BS CNS: Alert  but confused EXT: Right hip tenderness.  Dressing on right hip surgical wound is intact.           Data Reviewed:   I have personally reviewed following labs and imaging studies:  Labs: Labs show the following:   Basic Metabolic Panel: Recent Labs  Lab 04/27/20 0254 04/28/20 0838 04/29/20 0640  NA 134* 132* 132*  K 4.1 4.2 3.8  CL 99 102 100  CO2 25 25 25   GLUCOSE 127* 93 122*  BUN 21 20 20   CREATININE 0.93 0.89 0.89  CALCIUM 9.4 8.1* 8.5*   GFR Estimated Creatinine Clearance: 34.4 mL/min (by C-G formula based on SCr of 0.89 mg/dL). Liver Function Tests: Recent Labs  Lab 04/27/20 0254  AST 32  ALT 21  ALKPHOS 61  BILITOT 0.9  PROT 7.5  ALBUMIN 4.0   No results for input(s): LIPASE, AMYLASE in the last 168 hours. No results for input(s): AMMONIA in the last 168 hours. Coagulation profile Recent Labs  Lab 04/27/20 0254  INR 1.0    CBC: Recent Labs  Lab 04/27/20 0254 04/28/20 0838 04/29/20 0640  WBC 7.8 5.0 6.4  NEUTROABS 5.8 3.3 4.6  HGB 13.0 9.8* 11.6*  HCT 38.0 29.0* 33.0*  MCV 92.5 92.9 91.4  PLT 261 181 233   Cardiac Enzymes: No results for input(s): CKTOTAL, CKMB, CKMBINDEX, TROPONINI in the last 168 hours. BNP (last 3 results) No results for input(s): PROBNP in the last 8760 hours. CBG: No results for input(s): GLUCAP in the last 168 hours. D-Dimer: No results for input(s): DDIMER in the last 72 hours. Hgb A1c: No results for input(s): HGBA1C in the last 72 hours. Lipid Profile: No results for input(s): CHOL, HDL, LDLCALC, TRIG, CHOLHDL, LDLDIRECT in the last 72 hours. Thyroid function studies: No results for input(s): TSH, T4TOTAL, T3FREE, THYROIDAB in the last 72 hours.  Invalid input(s): FREET3 Anemia work up: No results for input(s): VITAMINB12, FOLATE, FERRITIN, TIBC, IRON, RETICCTPCT in the last 72 hours. Sepsis Labs: Recent Labs  Lab 04/27/20 0254 04/28/20 0838 04/29/20 0640  WBC 7.8 5.0 6.4     Microbiology Recent Results (from the past 240 hour(s))  Resp Panel by RT-PCR (Flu A&B, Covid) Nasopharyngeal Swab     Status: None   Collection Time: 04/27/20  2:54 AM   Specimen: Nasopharyngeal Swab; Nasopharyngeal(NP) swabs in vial transport medium  Result Value Ref Range Status   SARS Coronavirus 2 by RT PCR NEGATIVE NEGATIVE Final  Comment: (NOTE) SARS-CoV-2 target nucleic acids are NOT DETECTED.  The SARS-CoV-2 RNA is generally detectable in upper respiratory specimens during the acute phase of infection. The lowest concentration of SARS-CoV-2 viral copies this assay can detect is 138 copies/mL. A negative result does not preclude SARS-Cov-2 infection and should not be used as the sole basis for treatment or other patient management decisions. A negative result may occur with  improper specimen collection/handling, submission of specimen other than nasopharyngeal swab, presence of viral mutation(s) within the areas targeted by this assay, and inadequate number of viral copies(<138 copies/mL). A negative result must be combined with clinical observations, patient history, and epidemiological information. The expected result is Negative.  Fact Sheet for Patients:  BloggerCourse.com  Fact Sheet for Healthcare Providers:  SeriousBroker.it  This test is no t yet approved or cleared by the [place] FDA and  has been authorized for detection and/or diagnosis of SARS-CoV-2 by FDA under an Emergency Use Authorization (EUA). This EUA will remain  in effect (meaning this test can be used) for the duration of the COVID-19 declaration under Section 564(b)(1) of the Act, 21 U.S.C.section 360bbb-3(b)(1), unless the authorization is terminated  or revoked sooner.       Influenza A by PCR NEGATIVE NEGATIVE Final   Influenza B by PCR NEGATIVE NEGATIVE Final    Comment: (NOTE) The Xpert Xpress SARS-CoV-2/FLU/RSV plus assay is  intended as an aid in the diagnosis of influenza from Nasopharyngeal swab specimens and should not be used as a sole basis for treatment. Nasal washings and aspirates are unacceptable for Xpert Xpress SARS-CoV-2/FLU/RSV testing.  Fact Sheet for Patients: BloggerCourse.com  Fact Sheet for Healthcare Providers: SeriousBroker.it  This test is not yet approved or cleared by the [place] FDA and has been authorized for detection and/or diagnosis of SARS-CoV-2 by FDA under an Emergency Use Authorization (EUA). This EUA will remain in effect (meaning this test can be used) for the duration of the COVID-19 declaration under Section 564(b)(1) of the Act, 21 U.S.C. section 360bbb-3(b)(1), unless the authorization is terminated or revoked.  Performed at [facility], [address] [place], [place] [address]     Procedures and diagnostic studies:  No results found.             LOS: 2 days   Jeremiyah Cullens  [facility]   Pager on [email or web address]. If 7PM-7AM, please contact night-coverage at [email or web address]     04/29/2020, 12:52 PM

## 2020-04-29 NOTE — TOC Initial Note (Signed)
Transition of Care [facility]) - Initial/Assessment Note    Patient Details  Name: Ashley Cantrell MRN: [id number] Date of Birth: 10-Apr-1925  Transition of [facility]) CM/SW Contact:    Caryn Section, RN Phone Number: 04/29/2020, 3:27 PM  Clinical Narrative:    TOC at bedside, introductions made.  Patient is very hard of hearing and not oriented to time and place.  Daughter Angelique Blonder at bedside with sign language interpreter (daughter is deaf). Purpose of visit:  Discharge planning. Patient lives with daughter, who is primary caregiver at home.  Recommendation by care team is for patient to go to SNF, daughter is amenable to plan as she does not feel safe caring for her mother alone at this time.  TOC to begin SNF process.  TOC will follow through discharge, daughter has TOC information for any questions/concerns.  Expected Discharge Plan: Skilled Nursing Facility Barriers to Discharge: Continued Medical Work up   Patient Goals and CMS Choice   CMS Medicare.gov Compare Post Acute Care list provided to:: Patient Choice offered to / list presented to : Adult Children  Expected Discharge Plan and Services Expected Discharge Plan: Skilled Nursing Facility     Post Acute Care Choice: Skilled Nursing Facility Living arrangements for the past 2 months: Single Family Home                                      Prior Living Arrangements/Services Living arrangements for the past 2 months: Single Family Home Lives with:: Adult Children Patient language and need for interpreter reviewed:: Yes        Need for Family Participation in Patient Care: Yes (Comment) Care giver support system in place?: Yes (comment)   Criminal Activity/Legal Involvement Pertinent to Current Situation/Hospitalization: No - Comment as needed  Activities of Daily Living Home Assistive Devices/Equipment: Cane (specify quad or straight) ADL Screening (condition at time of admission) Patient's cognitive ability  adequate to safely complete daily activities?: No Is the patient deaf or have difficulty hearing?: Yes Does the patient have difficulty seeing, even when wearing glasses/contacts?: Yes Does the patient have difficulty concentrating, remembering, or making decisions?: No Patient able to express need for assistance with ADLs?: Yes Does the patient have difficulty dressing or bathing?: No Independently performs ADLs?: Yes (appropriate for developmental age) Does the patient have difficulty walking or climbing stairs?: Yes Weakness of Legs: Right Weakness of Arms/Hands: None  Permission Sought/Granted                  Emotional Assessment Appearance:: Appears stated age Attitude/Demeanor/Rapport: Gracious Affect (typically observed): Calm,Pleasant Orientation: : Oriented to Self (Not oriented to time or place) Alcohol / Substance Use: Not Applicable Psych Involvement: No (comment)  Admission diagnosis:  Closed right hip fracture (HCC) [S72.001A] Closed displaced fracture of right femoral neck (HCC) [S72.001A] Patient Active Problem List   Diagnosis Date Noted  . Hearing impairment 04/27/2020  . Closed displaced fracture of right femoral neck (HCC) 04/27/2020  . History of fracture of left hip 04/27/2020  . Osteoporosis 04/27/2020  . Unwitnessed fall 04/27/2020  . Preoperative clearance 04/27/2020  . Closed right hip fracture (HCC) 04/27/2020  . Hip fracture (HCC) 10/27/2016  . Type 2 diabetes, diet controlled (HCC) 12/26/2014  . Essential hypertension 07/31/2013  . Hypothyroidism 07/31/2013   PCP:  Medicine, [facility]:   [facility] [phone] - [place],  - [address]  [address] [place] [address] Phone: [phone] Fax: [phone]     Social Determinants of Health (SDOH) Interventions    Readmission Risk Interventions No flowsheet data found.

## 2020-04-29 NOTE — Evaluation (Signed)
Physical Therapy Evaluation Patient Details Name: Ashley Cantrell MRN: [id number] DOB: 05-10-1925 Today's Date: 04/29/2020   History of Present Illness  Pt is a 87 y.o. female with medical history significant for hypothyroidism, HTN, type 2 diabetes, hearing impairment, osteoporosis with history of left hip fracture who presents by EMS following an unwitnessed fall sustaining injury to the right hip and right chin.  Pt diagnosed with displaced right subcapital hip fracture and is s/p R hip anterior hemiarthroplasty.    Clinical Impression  Pt was pleasant and put forth good effort during the session but was very HOH and required visual and tactile cues as well as occasional written notes in order to follow commands.  Pt required extensive physical assistance with all functional tasks and was quite limited by pain with weight bearing.  Pt was only able to take several very small steps from the EOB to the chair and was limited by severe HOH and corresponding inability to be given rapid cues for sequencing in real time.  Pt will benefit from PT services in a SNF setting upon discharge to safely address deficits listed in patient problem list for decreased caregiver assistance and eventual return to PLOF.     Follow Up Recommendations SNF    Equipment Recommendations  None recommended by PT    Recommendations for Other Services       Precautions / Restrictions Precautions Precautions: Fall;Anterior Hip Precaution Booklet Issued: Yes (comment) Restrictions Weight Bearing Restrictions: Yes RLE Weight Bearing: Weight bearing as tolerated      Mobility  Bed Mobility Overal bed mobility: Needs Assistance Bed Mobility: Supine to Sit     Supine to sit: Mod assist     General bed mobility comments: Mod A for RLE and trunk control    Transfers Overall transfer level: Needs assistance Equipment used: Rolling walker (2 wheeled) Transfers: Sit to/from Stand Sit to Stand: +2  safety/equipment;Mod assist         General transfer comment: Mod visual and tactile cues for sequencing  Ambulation/Gait Ambulation/Gait assistance: Mod assist;+2 safety/equipment Gait Distance (Feet): 3 Feet Assistive device: Rolling walker (2 wheeled) Gait Pattern/deviations: Step-to pattern;Decreased stance time - right;Decreased step length - left;Antalgic Gait velocity: decreased   General Gait Details: Antalgic gait pattern on the RLE with heavy lean on the RW for support and tactile cues for sequencing  Stairs            Wheelchair Mobility    Modified Rankin (Stroke Patients Only)       Balance Overall balance assessment: Needs assistance Sitting-balance support: Bilateral upper extremity supported;Feet supported Sitting balance-Leahy Scale: Fair     Standing balance support: Bilateral upper extremity supported;During functional activity Standing balance-Leahy Scale: Poor Standing balance comment: Min to mod A for stability during ambulation                             Pertinent Vitals/Pain Pain Assessment: Faces Pain Score: 6  Faces Pain Scale: Hurts even more Pain Location: R hip; no pain at rest, 6/10 with ambulation Pain Descriptors / Indicators: Grimacing;Moaning Pain Intervention(s): Premedicated before session;Monitored during session    Home Living Family/patient expects to be discharged to:: Private residence Living Arrangements: Children Available Help at Discharge: Family;Available 24 hours/day Type of Home: House Home Access: Ramped entrance     Home Layout: One level Home Equipment: Walker - 2 wheels;Cane - single point;Bedside commode Additional Comments: Pt lives with daughter who  is deaf and has CP; pt was unable to provide any history secondary to severe HOH with history provided by daughter with assist from family friend who signed for the daughter    Prior Function Level of Independence: Needs assistance   Gait /  Transfers Assistance Needed: Mod Ind household ambulator with a SPC, 2 falls in the last 6 months  ADL's / Homemaking Assistance Needed: Ind bathing, assist from daughter with dressing        Hand Dominance        Extremity/Trunk Assessment   Upper Extremity Assessment Upper Extremity Assessment: Generalized weakness    Lower Extremity Assessment Lower Extremity Assessment: Generalized weakness;RLE deficits/detail RLE Deficits / Details: Difficult to assess secondary to severe HOH RLE: Unable to fully assess due to pain       Communication   Communication: HOH  Cognition Arousal/Alertness: Awake/alert Behavior During Therapy: WFL for tasks assessed/performed Overall Cognitive Status: Difficult to assess                                        General Comments      Exercises Total Joint Exercises Heel Slides: AAROM;Both;Strengthening;5 reps Hip ABduction/ADduction: AAROM;Strengthening;Both;5 reps Straight Leg Raises: AAROM;Strengthening;Both;5 reps Long Arc Quad: AROM;Strengthening;Right;10 reps Knee Flexion: AROM;Strengthening;Right;10 reps   Assessment/Plan    PT Assessment Patient needs continued PT services  PT Problem List Decreased strength;Decreased activity tolerance;Decreased balance;Decreased mobility;Decreased knowledge of use of DME;Pain       PT Treatment Interventions DME instruction;Gait training;Functional mobility training;Therapeutic activities;Therapeutic exercise;Balance training;Patient/family education    PT Goals (Current goals can be found in the Care Plan section)  Acute Rehab PT Goals PT Goal Formulation: Patient unable to participate in goal setting Time For Goal Achievement: 05/12/20 Potential to Achieve Goals: Fair    Frequency 7X/week   Barriers to discharge Inaccessible home environment;Decreased caregiver support      Co-evaluation               AM-PAC PT "6 Clicks" Mobility  Outcome Measure Help  needed turning from your back to your side while in a flat bed without using bedrails?: A Lot Help needed moving from lying on your back to sitting on the side of a flat bed without using bedrails?: A Lot Help needed moving to and from a bed to a chair (including a wheelchair)?: A Lot Help needed standing up from a chair using your arms (e.g., wheelchair or bedside chair)?: A Lot Help needed to walk in hospital room?: Total Help needed climbing 3-5 steps with a railing? : Total 6 Click Score: 10    End of Session Equipment Utilized During Treatment: Gait belt Activity Tolerance: Patient tolerated treatment well Patient left: in chair;with call bell/phone within reach;with chair alarm set;with family/visitor present Nurse Communication: Mobility status PT Visit Diagnosis: Unsteadiness on feet (R26.81);History of falling (Z91.81);Other abnormalities of gait and mobility (R26.89);Muscle weakness (generalized) (M62.81);Pain Pain - Right/Left: Right Pain - part of body: Hip    Time: 8299-3716 PT Time Calculation (min) (ACUTE ONLY): 40 min   Charges:   PT Evaluation $PT Eval Moderate Complexity: 1 Mod PT Treatments $Therapeutic Exercise: 8-22 mins        D. Elly Modena PT, DPT 04/29/20, 12:55 PM

## 2020-04-30 DIAGNOSIS — I10 Essential (primary) hypertension: Secondary | ICD-10-CM

## 2020-04-30 DIAGNOSIS — H9193 Unspecified hearing loss, bilateral: Secondary | ICD-10-CM

## 2020-04-30 LAB — CBC WITH DIFFERENTIAL/PLATELET
Abs Immature Granulocytes: 0.05 10*3/uL (ref 0.00–0.07)
Basophils Absolute: 0 10*3/uL (ref 0.0–0.1)
Basophils Relative: 0 %
Eosinophils Absolute: 0 10*3/uL (ref 0.0–0.5)
Eosinophils Relative: 0 %
HCT: 28.8 % — ABNORMAL LOW (ref 36.0–46.0)
Hemoglobin: 9.6 g/dL — ABNORMAL LOW (ref 12.0–15.0)
Immature Granulocytes: 1 %
Lymphocytes Relative: 27 %
Lymphs Abs: 1.8 10*3/uL (ref 0.7–4.0)
MCH: 31.3 pg (ref 26.0–34.0)
MCHC: 33.3 g/dL (ref 30.0–36.0)
MCV: 93.8 fL (ref 80.0–100.0)
Monocytes Absolute: 0.7 10*3/uL (ref 0.1–1.0)
Monocytes Relative: 11 %
Neutro Abs: 4 10*3/uL (ref 1.7–7.7)
Neutrophils Relative %: 61 %
Platelets: 219 10*3/uL (ref 150–400)
RBC: 3.07 MIL/uL — ABNORMAL LOW (ref 3.87–5.11)
RDW: 15.2 % (ref 11.5–15.5)
WBC: 6.6 10*3/uL (ref 4.0–10.5)
nRBC: 0 % (ref 0.0–0.2)

## 2020-04-30 NOTE — Evaluation (Signed)
Occupational Therapy Evaluation Patient Details Name: Ashley Cantrell MRN: [id number] DOB: 1925/09/03 Today's Date: 04/30/2020    History of Present Illness Pt is a 87 y.o. female with medical history significant for hypothyroidism, HTN, type 2 diabetes, hearing impairment, osteoporosis with history of left hip fracture who presents by EMS following an unwitnessed fall sustaining injury to the right hip and right chin.  Pt diagnosed with displaced right subcapital hip fracture and is s/p R hip anterior hemiarthroplasty.   Clinical Impression   Ashley Cantrell presents today POD #3 from R hip anterior hemiarthroplasty. Pt very pleasant, made good effort, and displays more mobility than when she was seen yesterday by PT. She was Min A for bed mobility, transfers, standing balance (compared to Mod A +2 yesterday), and displayed surprising forward flexibility, able to don socks and shoes with SUPV only on L LE and Min A on R LE. She was also able to apply lotion to b/l LE, easily reaching down to her ankles. Pt is extremely HOH, making it difficult to ascertain pt's home situation, PLOF, or cognition. Pt was unaware that she had had surgery, repeatedly saying, "I don't know why my leg is so sore after just a little fall." If she has 24/hr support from caregivers who can provide MinA with ADL, pt could potentially go home with HHOT. If such home help is not available, however, pt will require SNF level of care. Pt will benefit from ongoing OT services while hospitalized.    Follow Up Recommendations  SNF (or HHOT, if pt has good caregiver support)    Equipment Recommendations       Recommendations for Other Services       Precautions / Restrictions Precautions Precautions: Fall;Anterior Hip Restrictions Weight Bearing Restrictions: Yes RLE Weight Bearing: Weight bearing as tolerated      Mobility Bed Mobility Overal bed mobility: Needs Assistance Bed Mobility: Supine to Sit     Supine to  sit: Min assist     General bed mobility comments: Min A for trunk control and RLE.    Transfers Overall transfer level: Needs assistance Equipment used: Rolling walker (2 wheeled) Transfers: Sit to/from Stand Sit to Stand: Min assist              Balance Overall balance assessment: Needs assistance Sitting-balance support: Bilateral upper extremity supported;Feet supported Sitting balance-Leahy Scale: Good     Standing balance support: Bilateral upper extremity supported;During functional activity Standing balance-Leahy Scale: Poor Standing balance comment: Min to mod A for stability during ambulation                           ADL either performed or assessed with clinical judgement   ADL Overall ADL's : Needs assistance/impaired Eating/Feeding: Modified independent     Grooming Details (indicate cue type and reason): Mod I with applying lotion to b/l LE             Lower Body Dressing: Minimal assistance Lower Body Dressing Details (indicate cue type and reason): donning and doffing socks and shoes. Min Guard with L LE, Min A with R LE                     Vision Patient Visual Report: No change from baseline       Perception     Praxis      Pertinent Vitals/Pain Pain Location: Pt reports "no pain," but states that hip is "sore"  Pain Descriptors / Indicators: Grimacing;Other (Comment) (pt rubbing/massaging RLE) Pain Intervention(s): Repositioned;Limited activity within patient's tolerance     Hand Dominance Right   Extremity/Trunk Assessment Upper Extremity Assessment Upper Extremity Assessment: Generalized weakness   Lower Extremity Assessment Lower Extremity Assessment: Generalized weakness;RLE deficits/detail       Communication Communication Communication: HOH   Cognition Arousal/Alertness: Awake/alert Behavior During Therapy: WFL for tasks assessed/performed Overall Cognitive Status: Difficult to assess                                  General Comments: Pt unaware that she has had surgery. Does not seem aware of time, place, situation, but difficult to determine pt's orientation, secondary to hearing impairment.   General Comments       Exercises Total Joint Exercises Ankle Circles/Pumps: AROM;10 reps;Both;Seated Hip ABduction/ADduction: AROM;Both;5 reps Straight Leg Raises: AROM;10 reps;Both;Seated Long Arc Quad: AROM;Right;10 reps;Seated Marching in Standing: Seated;Both;10 reps;AROM Other Exercises Other Exercises: Bed mobility, LB dressing, sit<>stand x 3, t/fs, sitting and standing balence tolerance, therex, grooming   Shoulder Instructions      Home Living Family/patient expects to be discharged to:: Private residence Living Arrangements: Children Available Help at Discharge: Family;Available 24 hours/day Type of Home: House Home Access: Ramped entrance     Home Layout: One level               Home Equipment: Walker - 2 wheels;Cane - single point;Bedside commode   Additional Comments: Pt lives with daughter who is deaf and has CP. Pt was unable to provide any information re: living situation or PLOF, 2/2 severe HOH. No family member present to assist with gathering history. Above info drawn from chart review.      Prior Functioning/Environment Level of Independence: Needs assistance  Gait / Transfers Assistance Needed: Mod Ind household ambulator with a SPC, 2 falls in the last 6 months ADL's / Homemaking Assistance Needed: Ind bathing, assist from daughter with dressing   Comments: information re: PLOF drawn from chart review. Pt unable to provide history.        OT Problem List: Decreased strength;Decreased range of motion;Decreased activity tolerance;Impaired balance (sitting and/or standing);Decreased safety awareness;Pain      OT Treatment/Interventions: Self-care/ADL training;Therapeutic exercise;Balance training;Patient/family education;DME and/or AE  instruction;Therapeutic activities    OT Goals(Current goals can be found in the care plan section) Acute Rehab OT Goals Patient Stated Goal: to go home OT Goal Formulation: With patient Time For Goal Achievement: 05/14/20 Potential to Achieve Goals: Good ADL Goals Pt Will Perform Lower Body Dressing: with modified independence;sit to/from stand Pt Will Transfer to Toilet: with supervision (using LRAD) Pt Will Perform Toileting - Clothing Manipulation and hygiene: with supervision;sit to/from stand  OT Frequency: Min 2X/week   Barriers to D/C:    unclear what level of caregiver support is available to this pt       Co-evaluation              AM-PAC OT "6 Clicks" Daily Activity     Outcome Measure Help from another person eating meals?: A Little Help from another person taking care of personal grooming?: A Little Help from another person toileting, which includes using toliet, bedpan, or urinal?: A Lot Help from another person bathing (including washing, rinsing, drying)?: A Lot Help from another person to put on and taking off regular upper body clothing?: A Little Help from another person to put on and  taking off regular lower body clothing?: A Lot 6 Click Score: 15   End of Session Equipment Utilized During Treatment: Gait belt;Rolling walker  Activity Tolerance: Patient tolerated treatment well Patient left: in chair;with chair alarm set  OT Visit Diagnosis: Unsteadiness on feet (R26.81);Muscle weakness (generalized) (M62.81)                Time: 3790-2409 OT Time Calculation (min): 29 min Charges:  OT General Charges $OT Visit: 1 Visit OT Evaluation $OT Eval Moderate Complexity: 1 Mod OT Treatments $Self Care/Home Management : 23-37 mins  Latina Craver, PhD, MS, OTR/L 04/30/20, 10:12 AM

## 2020-04-30 NOTE — Progress Notes (Signed)
  Subjective:  Patient reports pain as mild.    Objective:   VITALS:   Vitals:   04/29/20 1517 04/29/20 2006 04/29/20 2358 04/30/20 0340  BP: (!) 131/55 (!) 157/71 (!) 131/50 127/63  Pulse: 81 82 61 66  Resp: 16 19 16 17   Temp: 98.9 F (37.2 C) 98 F (36.7 C) (!) 97.5 F (36.4 C) 98.5 F (36.9 C)  TempSrc:      SpO2: 98% 100% 98% 97%  Weight:      Height:        PHYSICAL EXAM:  Neurologically intact ABD soft Neurovascular intact Sensation intact distally Intact pulses distally Dorsiflexion/Plantar flexion intact Incision: dressing C/D/I No cellulitis present Compartment soft  LABS  Results for orders placed or performed during the hospital encounter of 04/27/20 (from the past 24 hour(s))  CBC with Differential/Platelet     Status: Abnormal   Collection Time: 04/30/20  5:09 AM  Result Value Ref Range   WBC 6.6 4.0 - 10.5 K/uL   RBC 3.07 (L) 3.87 - 5.11 MIL/uL   Hemoglobin 9.6 (L) 12.0 - 15.0 g/dL   HCT 06/30/20 (L) 41.2 - 87.8 %   MCV 93.8 80.0 - 100.0 fL   MCH 31.3 26.0 - 34.0 pg   MCHC 33.3 30.0 - 36.0 g/dL   RDW 67.6 72.0 - 94.7 %   Platelets 219 150 - 400 K/uL   nRBC 0.0 0.0 - 0.2 %   Neutrophils Relative % 61 %   Neutro Abs 4.0 1.7 - 7.7 K/uL   Lymphocytes Relative 27 %   Lymphs Abs 1.8 0.7 - 4.0 K/uL   Monocytes Relative 11 %   Monocytes Absolute 0.7 0.1 - 1.0 K/uL   Eosinophils Relative 0 %   Eosinophils Absolute 0.0 0.0 - 0.5 K/uL   Basophils Relative 0 %   Basophils Absolute 0.0 0.0 - 0.1 K/uL   Immature Granulocytes 1 %   Abs Immature Granulocytes 0.05 0.00 - 0.07 K/uL    No results found.  Assessment/Plan: 3 Days Post-Op   Principal Problem:   Closed displaced fracture of right femoral neck (HCC) Active Problems:   Essential hypertension   Hypothyroidism   Type 2 diabetes, diet controlled (HCC)   Hearing impairment   History of fracture of left hip   Osteoporosis   Unwitnessed fall   Preoperative clearance   Closed right hip  fracture (HCC)   Advance diet Up with therapy WBAT RLE Aspirin 81 mg twice daily for 30 days D/C per hospitialist Hemoglobin stable  Follow up in 2 weeks for staple removal in Dr. 09.6 office [phone]  Maxcine Ham , PA-C 04/30/2020, 6:43 AM

## 2020-04-30 NOTE — Plan of Care (Signed)

## 2020-04-30 NOTE — Progress Notes (Addendum)
PROGRESS NOTE    Ashley Cantrell  YOV:[id number] DOB: 1925/10/06 DOA: 04/27/2020 PCP: [facility]   Assessment & Plan:   Principal Problem:   Closed displaced fracture of right femoral neck (HCC) Active Problems:   Essential hypertension   Hypothyroidism   Type 2 diabetes, diet controlled (HCC)   Hearing impairment   History of fracture of left hip   Osteoporosis   Unwitnessed fall   Preoperative clearance   Closed right hip fracture (HCC)   Closed displaced fracture right femoral neck: s/p right anterior hip hemiarthroplasty on 04/27/2020. Continue on aspirin for 30 days for DVT prophylaxis as per ortho surg. PT recs SNF  Acute blood loss anemia: H&H are labile. No need for a transfusion currently   Hx of dementia: as per pt's daughter. Continue w/ supportive care. Haldol prn   HLD: continue on statin  Hypothyroidism: continue on home dose of levothyroxine   Hearing impairment: severe. Continue w/ supportive care  DVT prophylaxis: SCDs Code Status: DNR Family Communication: pt's daughter is at bedside and up to date. Pt's daughter is deaf.  Disposition Plan: SNF .  Level of care: Med-Surg   Status is: Inpatient  Remains inpatient appropriate because:Unsafe d/c plan and Inpatient level of care appropriate due to severity of illness   Dispo: The patient is from: Home              Anticipated d/c is to: SNF              Patient currently is not medically stable to d/c.   Difficult to place patient Yes        Consultants:      Procedures:    Antimicrobials:   Subjective: Pt is very hard of hearing. Pt denies any pain  Objective: Vitals:   04/29/20 2006 04/29/20 2358 04/30/20 0340 04/30/20 0815  BP: (!) 157/71 (!) 131/50 127/63 (!) 136/59  Pulse: 82 61 66 79  Resp: 19 16 17 16   Temp: 98 F (36.7 C) (!) 97.5 F (36.4 C) 98.5 F (36.9 C) (!) 97.4 F (36.3 C)  TempSrc:    Oral  SpO2: 100% 98% 97% 100%  Weight:      Height:         Intake/Output Summary (Last 24 hours) at 04/30/2020 0856 Last data filed at 04/29/2020 1838 Gross per 24 hour  Intake 360 ml  Output --  Net 360 ml   Filed Weights   04/27/20 0114  Weight: 54 kg    Examination:  General exam: Appears calm and comfortable. Talks loudly  Respiratory system: Clear to auscultation. Respiratory effort normal. Cardiovascular system: S1 & S2 +. No rubs, gallops or clicks.  Gastrointestinal system: Abdomen is nondistended, soft and nontender. Normal bowel sounds heard. Central nervous system: Alert and awake. Moves all 4 extremities  Psychiatry: Judgement and insight appear abnormal. Mood & affect appropriate.     Data Reviewed: I have personally reviewed following labs and imaging studies  CBC: Recent Labs  Lab 04/27/20 0254 04/28/20 0838 04/29/20 0640 04/30/20 0509  WBC 7.8 5.0 6.4 6.6  NEUTROABS 5.8 3.3 4.6 4.0  HGB 13.0 9.8* 11.6* 9.6*  HCT 38.0 29.0* 33.0* 28.8*  MCV 92.5 92.9 91.4 93.8  PLT 261 181 233 219   Basic Metabolic Panel: Recent Labs  Lab 04/27/20 0254 04/28/20 0838 04/29/20 0640  NA 134* 132* 132*  K 4.1 4.2 3.8  CL 99 102 100  CO2 25 25 25   GLUCOSE 127*  93 122*  BUN 21 20 20   CREATININE 0.93 0.89 0.89  CALCIUM 9.4 8.1* 8.5*   GFR: Estimated Creatinine Clearance: 34.4 mL/min (by C-G formula based on SCr of 0.89 mg/dL). Liver Function Tests: Recent Labs  Lab 04/27/20 0254  AST 32  ALT 21  ALKPHOS 61  BILITOT 0.9  PROT 7.5  ALBUMIN 4.0   No results for input(s): LIPASE, AMYLASE in the last 168 hours. No results for input(s): AMMONIA in the last 168 hours. Coagulation Profile: Recent Labs  Lab 04/27/20 0254  INR 1.0   Cardiac Enzymes: No results for input(s): CKTOTAL, CKMB, CKMBINDEX, TROPONINI in the last 168 hours. BNP (last 3 results) No results for input(s): PROBNP in the last 8760 hours. HbA1C: No results for input(s): HGBA1C in the last 72 hours. CBG: No results for input(s): GLUCAP in  the last 168 hours. Lipid Profile: No results for input(s): CHOL, HDL, LDLCALC, TRIG, CHOLHDL, LDLDIRECT in the last 72 hours. Thyroid Function Tests: No results for input(s): TSH, T4TOTAL, FREET4, T3FREE, THYROIDAB in the last 72 hours. Anemia Panel: No results for input(s): VITAMINB12, FOLATE, FERRITIN, TIBC, IRON, RETICCTPCT in the last 72 hours. Sepsis Labs: No results for input(s): PROCALCITON, LATICACIDVEN in the last 168 hours.  Recent Results (from the past 240 hour(s))  Resp Panel by RT-PCR (Flu A&B, Covid) Nasopharyngeal Swab     Status: None   Collection Time: 04/27/20  2:54 AM   Specimen: Nasopharyngeal Swab; Nasopharyngeal(NP) swabs in vial transport medium  Result Value Ref Range Status   SARS Coronavirus 2 by RT PCR NEGATIVE NEGATIVE Final    Comment: (NOTE) SARS-CoV-2 target nucleic acids are NOT DETECTED.  The SARS-CoV-2 RNA is generally detectable in upper respiratory specimens during the acute phase of infection. The lowest concentration of SARS-CoV-2 viral copies this assay can detect is 138 copies/mL. A negative result does not preclude SARS-Cov-2 infection and should not be used as the sole basis for treatment or other patient management decisions. A negative result may occur with  improper specimen collection/handling, submission of specimen other than nasopharyngeal swab, presence of viral mutation(s) within the areas targeted by this assay, and inadequate number of viral copies(<138 copies/mL). A negative result must be combined with clinical observations, patient history, and epidemiological information. The expected result is Negative.  Fact Sheet for Patients:  06/27/20  Fact Sheet for Healthcare Providers:  BloggerCourse.com  This test is no t yet approved or cleared by the SeriousBroker.it FDA and  has been authorized for detection and/or diagnosis of SARS-CoV-2 by FDA under an Emergency Use  Authorization (EUA). This EUA will remain  in effect (meaning this test can be used) for the duration of the COVID-19 declaration under Section 564(b)(1) of the Act, 21 U.S.C.section 360bbb-3(b)(1), unless the authorization is terminated  or revoked sooner.       Influenza A by PCR NEGATIVE NEGATIVE Final   Influenza B by PCR NEGATIVE NEGATIVE Final    Comment: (NOTE) The Xpert Xpress SARS-CoV-2/FLU/RSV plus assay is intended as an aid in the diagnosis of influenza from Nasopharyngeal swab specimens and should not be used as a sole basis for treatment. Nasal washings and aspirates are unacceptable for Xpert Xpress SARS-CoV-2/FLU/RSV testing.  Fact Sheet for Patients: [place]  Fact Sheet for Healthcare Providers: BloggerCourse.com  This test is not yet approved or cleared by the SeriousBroker.it FDA and has been authorized for detection and/or diagnosis of SARS-CoV-2 by FDA under an Emergency Use Authorization (EUA). This EUA will remain  in effect (meaning this test can be used) for the duration of the COVID-19 declaration under Section 564(b)(1) of the Act, 21 U.S.C. section 360bbb-3(b)(1), unless the authorization is terminated or revoked.  Performed at [facility], [address], [place], [place] [address]          Radiology Studies: No results found.      Scheduled Meds: . amLODipine  5 mg Oral Daily  . aspirin EC  81 mg Oral BID  . atorvastatin  20 mg Oral Daily  . Chlorhexidine Gluconate Cloth  6 each Topical Daily  . docusate sodium  100 mg Oral BID  . dorzolamide-timolol  1 drop Both Eyes BID  . levothyroxine  75 mcg Oral QAC breakfast  . tranexamic acid (CYKLOKAPRON) topical - INTRAOP  2,000 mg Topical Once   Continuous Infusions:   LOS: 3 days    Time spent: 33 mins    Charise Killian, MD [facility] Pager [phone]  If 7PM-7AM, please contact  night-coverage 04/30/2020, 8:56 AM

## 2020-04-30 NOTE — Progress Notes (Signed)
Physical Therapy Treatment Patient Details Name: Ashley Cantrell MRN: [id number] DOB: 01/16/1926 Today's Date: 04/30/2020    History of Present Illness Pt is a 87 y.o. female with medical history significant for hypothyroidism, HTN, type 2 diabetes, hearing impairment, osteoporosis with history of left hip fracture who presents by EMS following an unwitnessed fall sustaining injury to the right hip and right chin.  Pt diagnosed with displaced right subcapital hip fracture and is s/p R hip anterior hemiarthroplasty.    PT Comments    Pt was pleasant and motivated to participate during the session but was somewhat confused regarding her situation and required extra time to allow for written communication during the session.  Pt put forth good effort with below therex and was able to come to standing multiple times with cues for sequencing and Mod A.  Pt was able to take several steps with min A and heavy tactile cues for sequencing. Pt will benefit from PT services in a SNF setting upon discharge to safely address deficits listed in patient problem list for decreased caregiver assistance and eventual return to PLOF.     Follow Up Recommendations  SNF     Equipment Recommendations  None recommended by PT    Recommendations for Other Services       Precautions / Restrictions Precautions Precautions: Fall;Anterior Hip Precaution Booklet Issued: Yes (comment) Restrictions Weight Bearing Restrictions: Yes RLE Weight Bearing: Weight bearing as tolerated    Mobility  Bed Mobility        General bed mobility comments: NT, pt in recliner    Transfers Overall transfer level: Needs assistance Equipment used: Rolling walker (2 wheeled) Transfers: Sit to/from Stand Sit to Stand: Mod assist         General transfer comment: Mod visual and tactile cues for sequencing  Ambulation/Gait Ambulation/Gait assistance: Mod assist;+2 safety/equipment Gait Distance (Feet): 2 Feet x  2 Assistive device: Rolling walker (2 wheeled) Gait Pattern/deviations: Step-to pattern;Decreased stance time - right;Decreased step length - left;Antalgic Gait velocity: decreased   General Gait Details: Antalgic gait pattern on the RLE with heavy lean on the RW for support and tactile cues for sequencing   Stairs             Wheelchair Mobility    Modified Rankin (Stroke Patients Only)       Balance Overall balance assessment: Needs assistance Sitting-balance support: Bilateral upper extremity supported;Feet supported Sitting balance-Leahy Scale: Good     Standing balance support: Bilateral upper extremity supported;During functional activity Standing balance-Leahy Scale: Poor Standing balance comment: Min A for stability during ambulation                            Cognition Arousal/Alertness: Awake/alert Behavior During Therapy: WFL for tasks assessed/performed Overall Cognitive Status: Difficult to assess                                 General Comments: Pt unaware that she has had surgery; written update provided but with pt quickly forgetting again.      Exercises Total Joint Exercises Ankle Circles/Pumps: AROM;10 reps;Both;Seated;5 reps (Max visual and tactile cues for proper technique for all below therex) Towel Squeeze: Strengthening;Both;10 reps;5 reps Short Arc Quad: AAROM;AROM;Both;5 reps;10 reps Heel Slides: AAROM;Both;Strengthening;5 reps Hip ABduction/ADduction: Both;5 reps;AAROM;10 reps;Strengthening Straight Leg Raises: 10 reps;Both;AAROM;Strengthening;5 reps Long Arc Quad: AROM;Right;10 reps;Seated;15 reps Knee Flexion: AROM;Strengthening;10 reps;Seated;Right;15  reps Marching in Standing: AROM;Right;Standing;5 reps Other Exercises Other Exercises: Extensive time spent with written communication during the session to assist pt with situational awareness and POC    General Comments        Pertinent Vitals/Pain  Pain Assessment: Faces Pain Score: 8  Pain Location: R hip Pain Descriptors / Indicators: Grimacing;Moaning Pain Intervention(s): Premedicated before session;Monitored during session;Repositioned    Home Living Family/patient expects to be discharged to:: Private residence Living Arrangements: Children Available Help at Discharge: Family;Available 24 hours/day Type of Home: House Home Access: Ramped entrance   Home Layout: One level Home Equipment: Walker - 2 wheels;Cane - single point;Bedside commode Additional Comments: Pt lives with daughter who is deaf and has CP. Pt was unable to provide any information re: living situation or PLOF, 2/2 severe HOH. No family member present to assist with gathering history. Above info drawn from chart review.    Prior Function Level of Independence: Needs assistance  Gait / Transfers Assistance Needed: Mod Ind household ambulator with a SPC, 2 falls in the last 6 months ADL's / Homemaking Assistance Needed: Ind bathing, assist from daughter with dressing Comments: information re: PLOF drawn from chart review. Pt unable to provide history.   PT Goals (current goals can now be found in the care plan section) Acute Rehab PT Goals Patient Stated Goal: to go home Progress towards PT goals: Progressing toward goals    Frequency    7X/week      PT Plan Current plan remains appropriate    Co-evaluation              AM-PAC PT "6 Clicks" Mobility   Outcome Measure  Help needed turning from your back to your side while in a flat bed without using bedrails?: A Lot Help needed moving from lying on your back to sitting on the side of a flat bed without using bedrails?: A Lot Help needed moving to and from a bed to a chair (including a wheelchair)?: A Lot Help needed standing up from a chair using your arms (e.g., wheelchair or bedside chair)?: A Lot Help needed to walk in hospital room?: Total Help needed climbing 3-5 steps with a railing? :  Total 6 Click Score: 10    End of Session Equipment Utilized During Treatment: Gait belt Activity Tolerance: Patient tolerated treatment well Patient left: in chair;with call bell/phone within reach;with chair alarm set Nurse Communication: Mobility status PT Visit Diagnosis: Unsteadiness on feet (R26.81);History of falling (Z91.81);Other abnormalities of gait and mobility (R26.89);Muscle weakness (generalized) (M62.81);Pain Pain - Right/Left: Right Pain - part of body: Hip     Time: 4818-5631 PT Time Calculation (min) (ACUTE ONLY): 39 min  Charges:  $Gait Training: 8-22 mins $Therapeutic Exercise: 8-22 mins $Therapeutic Activity: 8-22 mins                     D. Scott Tysen Roesler PT, DPT 04/30/20, 12:15 PM

## 2020-05-01 LAB — BASIC METABOLIC PANEL
Anion gap: 7 (ref 5–15)
BUN: 21 mg/dL (ref 8–23)
CO2: 27 mmol/L (ref 22–32)
Calcium: 8.1 mg/dL — ABNORMAL LOW (ref 8.9–10.3)
Chloride: 102 mmol/L (ref 98–111)
Creatinine, Ser: 0.79 mg/dL (ref 0.44–1.00)
GFR, Estimated: >60 mL/min (ref >60)
Glucose, Bld: 123 mg/dL — ABNORMAL HIGH (ref 70–99)
Potassium: 3.9 mmol/L (ref 3.5–5.1)
Sodium: 136 mmol/L (ref 135–145)

## 2020-05-01 LAB — CBC
HCT: 33.1 % — ABNORMAL LOW (ref 36.0–46.0)
Hemoglobin: 11.4 g/dL — ABNORMAL LOW (ref 12.0–15.0)
MCH: 31.8 pg (ref 26.0–34.0)
MCHC: 34.4 g/dL (ref 30.0–36.0)
MCV: 92.2 fL (ref 80.0–100.0)
Platelets: 296 10*3/uL (ref 150–400)
RBC: 3.59 MIL/uL — ABNORMAL LOW (ref 3.87–5.11)
RDW: 14.9 % (ref 11.5–15.5)
WBC: 6 10*3/uL (ref 4.0–10.5)
nRBC: 0 % (ref 0.0–0.2)

## 2020-05-01 NOTE — Plan of Care (Signed)

## 2020-05-01 NOTE — Progress Notes (Addendum)
PROGRESS NOTE    Ashley Cantrell  PPI:[id number] DOB: 1925/06/15 DOA: 04/27/2020 PCP: [facility]   Assessment & Plan:   Principal Problem:   Closed displaced fracture of right femoral neck (HCC) Active Problems:   Essential hypertension   Hypothyroidism   Type 2 diabetes, diet controlled (HCC)   Hearing impairment   History of fracture of left hip   Osteoporosis   Unwitnessed fall   Preoperative clearance   Closed right hip fracture (HCC)   Closed displaced fracture right femoral neck: s/p right anterior hip hemiarthroplasty on 04/27/2020. Continue on aspirin for 30 days for DVT prophylaxis as per ortho surg. PT recs SNF  Acute blood loss anemia: H&H are trending up today. Will continue to monitor   Hx of dementia: continue w/ supportive care. Haldol prn   HLD: continue on statin   Hypothyroidism: continue on home dose of levothyroxine   Hearing impairment: severe. Continue w/ supportive care   DVT prophylaxis: SCDs Code Status: DNR Family Communication:  Disposition Plan: SNF. Awaiting SNF placement   Level of care: Med-Surg   Status is: Inpatient  Remains inpatient appropriate because:Unsafe d/c plan and Inpatient level of care appropriate due to severity of illness   Dispo: The patient is from: Home              Anticipated d/c is to: SNF              Patient currently is medically stable for d/c    Difficult to place patient Yes        Consultants:      Procedures:    Antimicrobials:   Subjective: Pt denies any pain. Pt is very hard of hearing & did not verbalize any complaints. Pt is oriented to person  Objective: Vitals:   04/30/20 2013 05/01/20 0013 05/01/20 0418 05/01/20 0748  BP: (!) 130/59 (!) 153/66 (!) 141/64 140/67  Pulse: 68 66 75 76  Resp: 16 14 17 18   Temp: 98 F (36.7 C) 98.5 F (36.9 C) 98.5 F (36.9 C) 98.6 F (37 C)  TempSrc:      SpO2: 99% 99% 100% 100%  Weight:      Height:         Intake/Output Summary (Last 24 hours) at 05/01/2020 0809 Last data filed at 05/01/2020 0300 Gross per 24 hour  Intake 480 ml  Output 1050 ml  Net -570 ml   Filed Weights   04/27/20 0114  Weight: 54 kg    Examination:  General exam: Appears uncomfortable  Respiratory system: clear breath sounds b/l  Cardiovascular system: S1/S2+. No rubs or gallops Gastrointestinal system: Abd is soft, NT, ND & hypoactive bowel sounds  Central nervous system: Awake. Moves all 4 extremities  Psychiatry: Judgement and insight appears abnormal. Flat mood and affect     Data Reviewed: I have personally reviewed following labs and imaging studies  CBC: Recent Labs  Lab 04/27/20 0254 04/28/20 0838 04/29/20 0640 04/30/20 0509 05/01/20 0601  WBC 7.8 5.0 6.4 6.6 6.0  NEUTROABS 5.8 3.3 4.6 4.0  --   HGB 13.0 9.8* 11.6* 9.6* 11.4*  HCT 38.0 29.0* 33.0* 28.8* 33.1*  MCV 92.5 92.9 91.4 93.8 92.2  PLT 261 181 233 219 296   Basic Metabolic Panel: Recent Labs  Lab 04/27/20 0254 04/28/20 0838 04/29/20 0640 05/01/20 0601  NA 134* 132* 132* 136  K 4.1 4.2 3.8 3.9  CL 99 102 100 102  CO2 25 25 25  27  GLUCOSE 127* 93 122* 123*  BUN 21 20 20 21   CREATININE 0.93 0.89 0.89 0.79  CALCIUM 9.4 8.1* 8.5* 8.1*   GFR: Estimated Creatinine Clearance: 38.3 mL/min (by C-G formula based on SCr of 0.79 mg/dL). Liver Function Tests: Recent Labs  Lab 04/27/20 0254  AST 32  ALT 21  ALKPHOS 61  BILITOT 0.9  PROT 7.5  ALBUMIN 4.0   No results for input(s): LIPASE, AMYLASE in the last 168 hours. No results for input(s): AMMONIA in the last 168 hours. Coagulation Profile: Recent Labs  Lab 04/27/20 0254  INR 1.0   Cardiac Enzymes: No results for input(s): CKTOTAL, CKMB, CKMBINDEX, TROPONINI in the last 168 hours. BNP (last 3 results) No results for input(s): PROBNP in the last 8760 hours. HbA1C: No results for input(s): HGBA1C in the last 72 hours. CBG: No results for input(s): GLUCAP in  the last 168 hours. Lipid Profile: No results for input(s): CHOL, HDL, LDLCALC, TRIG, CHOLHDL, LDLDIRECT in the last 72 hours. Thyroid Function Tests: No results for input(s): TSH, T4TOTAL, FREET4, T3FREE, THYROIDAB in the last 72 hours. Anemia Panel: No results for input(s): VITAMINB12, FOLATE, FERRITIN, TIBC, IRON, RETICCTPCT in the last 72 hours. Sepsis Labs: No results for input(s): PROCALCITON, LATICACIDVEN in the last 168 hours.  Recent Results (from the past 240 hour(s))  Resp Panel by RT-PCR (Flu A&B, Covid) Nasopharyngeal Swab     Status: None   Collection Time: 04/27/20  2:54 AM   Specimen: Nasopharyngeal Swab; Nasopharyngeal(NP) swabs in vial transport medium  Result Value Ref Range Status   SARS Coronavirus 2 by RT PCR NEGATIVE NEGATIVE Final    Comment: (NOTE) SARS-CoV-2 target nucleic acids are NOT DETECTED.  The SARS-CoV-2 RNA is generally detectable in upper respiratory specimens during the acute phase of infection. The lowest concentration of SARS-CoV-2 viral copies this assay can detect is 138 copies/mL. A negative result does not preclude SARS-Cov-2 infection and should not be used as the sole basis for treatment or other patient management decisions. A negative result may occur with  improper specimen collection/handling, submission of specimen other than nasopharyngeal swab, presence of viral mutation(s) within the areas targeted by this assay, and inadequate number of viral copies(<138 copies/mL). A negative result must be combined with clinical observations, patient history, and epidemiological information. The expected result is Negative.  Fact Sheet for Patients:  06/27/20  Fact Sheet for Healthcare Providers:  BloggerCourse.com  This test is no t yet approved or cleared by the [employer]ious[employer] FDA and  has been authorized for detection and/or diagnosis of SARS-CoV-2 by FDA under an Emergency Use  Authorization (EUA). This EUA will remain  in effect (meaning this test can be used) for the duration of the COVID-19 declaration under Section 564(b)(1) of the Act, 21 U.S.C.section 360bbb-3(b)(1), unless the authorization is terminated  or revoked sooner.       Influenza A by PCR NEGATIVE NEGATIVE Final   Influenza B by PCR NEGATIVE NEGATIVE Final    Comment: (NOTE) The Xpert Xpress SARS-CoV-2/FLU/RSV plus assay is intended as an aid in the diagnosis of influenza from Nasopharyngeal swab specimens and should not be used as a sole basis for treatment. Nasal washings and aspirates are unacceptable for Xpert Xpress SARS-CoV-2/FLU/RSV testing.  Fact Sheet for Patients: [place]  Fact Sheet for Healthcare Providers: BloggerCourse.com  This test is not yet approved or cleared by the [employer]iousBroker.it FDA and has been authorized for detection and/or diagnosis of SARS-CoV-2 by FDA under an Emergency Use  Authorization (EUA). This EUA will remain in effect (meaning this test can be used) for the duration of the COVID-19 declaration under Section 564(b)(1) of the Act, 21 U.S.C. section 360bbb-3(b)(1), unless the authorization is terminated or revoked.  Performed at [facility], [address], [place], [place] [address]          Radiology Studies: No results found.      Scheduled Meds: . amLODipine  5 mg Oral Daily  . aspirin EC  81 mg Oral BID  . atorvastatin  20 mg Oral Daily  . Chlorhexidine Gluconate Cloth  6 each Topical Daily  . docusate sodium  100 mg Oral BID  . dorzolamide-timolol  1 drop Both Eyes BID  . levothyroxine  75 mcg Oral QAC breakfast  . tranexamic acid (CYKLOKAPRON) topical - INTRAOP  2,000 mg Topical Once   Continuous Infusions:   LOS: 4 days    Time spent: 30 mins    Charise Killian, MD [facility] Pager [phone]  If 7PM-7AM, please contact  night-coverage 05/01/2020, 8:09 AM

## 2020-05-01 NOTE — Care Management Important Message (Signed)
Important Message  Patient Details  Name: Ashley Cantrell MRN: [id number] Date of Birth: 1925-12-15   Medicare Important Message Given:  Yes  Patients daughter [name] was in the room with her mother.  Daughter is hearing impaired and she offered me a sheet of paper to communicate with her regarding the Important Message from Medicare.  I introduced myself and let her know why I was there.  I gave her the form to read over and while she was doing that I provided a written overview of the form and pointed out the TTY #.  She is in agreement with the discharge plan and thanked me for going over it.  I thanked her for her time.  [facility] A Allmond 05/01/2020, 11:42 AM

## 2020-05-01 NOTE — Progress Notes (Signed)
  Subjective:  Patient reports pain as mild.    Objective:   VITALS:   Vitals:   04/30/20 1547 04/30/20 2013 05/01/20 0013 05/01/20 0418  BP: (!) 144/60 (!) 130/59 (!) 153/66 (!) 141/64  Pulse: 72 68 66 75  Resp: 16 16 14 17   Temp: 98.3 F (36.8 C) 98 F (36.7 C) 98.5 F (36.9 C) 98.5 F (36.9 C)  TempSrc: Oral     SpO2: 99% 99% 99% 100%  Weight:      Height:        PHYSICAL EXAM:  Neurologically intact ABD soft Neurovascular intact Sensation intact distally Intact pulses distally Dorsiflexion/Plantar flexion intact Incision: dressing C/D/I No cellulitis present Compartment soft  LABS  No results found for this or any previous visit (from the past 24 hour(s)).  No results found.  Assessment/Plan: 4 Days Post-Op   Principal Problem:   Closed displaced fracture of right femoral neck (HCC) Active Problems:   Essential hypertension   Hypothyroidism   Type 2 diabetes, diet controlled (HCC)   Hearing impairment   History of fracture of left hip   Osteoporosis   Unwitnessed fall   Preoperative clearance   Closed right hip fracture (HCC)   Advance diet Up with therapy WBAT RLE Aspirin 81 mg twice daily for 30 days D/C per hospitialist Hemoglobin stable  Follow up in 2 weeks for staple removal in Dr. office [phone]   Maxcine Ham , PA-C 05/01/2020, 6:31 AM

## 2020-05-01 NOTE — TOC Progression Note (Signed)
Transition of Care [facility]) - Progression Note    Patient Details  Name: Ashley Cantrell MRN: [id number] Date of Birth: October 26, 1925  Transition of Care [facility]) CM/SW Contact  Caryn Section, RN Phone Number: 05/01/2020, 10:53 AM  Clinical Narrative:   TOC at bedside.  Patient daughter Angelique Blonder, interpreter Joyce Gross and friend Britta Mccreedy at bedside.  Topic:  Discharge planning.  All amenable to discussion.  Patient was accepted for admission at [facility] and [facility].  Family preferred [facility], bed accepted at [facility], facilities notified.  Facility will proceed with insurance authorization.  Family poses no questions or concerns at this time, TOC contact information communicated.  TOC will follow through discharge.    Expected Discharge Plan: Skilled Nursing Facility Barriers to Discharge: Continued Medical Work up  Expected Discharge Plan and Services Expected Discharge Plan: Skilled Nursing Facility     Post Acute Care Choice: Skilled Nursing Facility Living arrangements for the past 2 months: Single Family Home                                       Social Determinants of Health (SDOH) Interventions    Readmission Risk Interventions No flowsheet data found.

## 2020-05-02 LAB — CBC
HCT: 28.4 % — ABNORMAL LOW (ref 36.0–46.0)
Hemoglobin: 9.6 g/dL — ABNORMAL LOW (ref 12.0–15.0)
MCH: 31.6 pg (ref 26.0–34.0)
MCHC: 33.8 g/dL (ref 30.0–36.0)
MCV: 93.4 fL (ref 80.0–100.0)
Platelets: 281 10*3/uL (ref 150–400)
RBC: 3.04 MIL/uL — ABNORMAL LOW (ref 3.87–5.11)
RDW: 15 % (ref 11.5–15.5)
WBC: 6.4 10*3/uL (ref 4.0–10.5)
nRBC: 0 % (ref 0.0–0.2)

## 2020-05-02 LAB — BASIC METABOLIC PANEL
Anion gap: 7 (ref 5–15)
BUN: 18 mg/dL (ref 8–23)
CO2: 25 mmol/L (ref 22–32)
Calcium: 8.1 mg/dL — ABNORMAL LOW (ref 8.9–10.3)
Chloride: 103 mmol/L (ref 98–111)
Creatinine, Ser: 0.89 mg/dL (ref 0.44–1.00)
GFR, Estimated: >60 mL/min (ref >60)
Glucose, Bld: 111 mg/dL — ABNORMAL HIGH (ref 70–99)
Potassium: 3.8 mmol/L (ref 3.5–5.1)
Sodium: 135 mmol/L (ref 135–145)

## 2020-05-02 NOTE — Progress Notes (Signed)
Physical Therapy Treatment Patient Details Name: Ashley Cantrell MRN: [id number] DOB: 1925-07-28 Today's Date: 05/02/2020    History of Present Illness Pt is a 87 y.o. female with medical history significant for hypothyroidism, HTN, type 2 diabetes, hearing impairment, osteoporosis with history of left hip fracture who presents by EMS following an unwitnessed fall sustaining injury to the right hip and right chin.  Pt diagnosed with displaced right subcapital hip fracture and is s/p R hip anterior hemiarthroplasty.    PT Comments    Pt was long sitting in bed with supportive deaf daughter and caregiver present. Pt is A however hard to assess orientation due to being [facility]. Used whiteboard to communicate. She was able to consistently follow commands. Overall very motivated to get OOB. Pt's lunch tray arrived during session. She was able to exit R side of bed, stand, and ambulate min-mod assist of one. Pt does have antalgic step to gait pattern. At conclusion of session, pt was in recliner with chair alarm in place and lunch tray set up. Pt would greatly benefit from SNF at [place] to continue to improve independence with ADLs while assisting pt to PLOF.    Follow Up Recommendations  SNF     Equipment Recommendations  None recommended by PT       Precautions / Restrictions Precautions Precautions: Fall;Anterior Hip Precaution Booklet Issued: Yes (comment)    Mobility  Bed Mobility Overal bed mobility: Needs Assistance Bed Mobility: Supine to Sit     Supine to sit: Min assist          Transfers Overall transfer level: Needs assistance Equipment used: Rolling walker (2 wheeled) Transfers: Sit to/from Stand Sit to Stand: Min assist;Mod assist;From elevated surface            Ambulation/Gait Ambulation/Gait assistance: Min assist   Assistive device: Rolling walker (2 wheeled) Gait Pattern/deviations: Step-to pattern;Decreased stance time - right;Decreased step length -  left;Antalgic Gait velocity: decreased   General Gait Details: Antalgic gait pattern on the RLE with heavy lean on the RW for support and tactile cues for sequencing       Balance Overall balance assessment: Needs assistance Sitting-balance support: Bilateral upper extremity supported;Feet supported Sitting balance-Leahy Scale: Good     Standing balance support: Bilateral upper extremity supported;During functional activity Standing balance-Leahy Scale: Fair Standing balance comment: reliant on UE support however no LOB during gait activity         Cognition Arousal/Alertness: Awake/alert Behavior During Therapy: WFL for tasks assessed/performed Overall Cognitive Status: Difficult to assess        General Comments: pt is extremely HOH. wrote on whiteboard for communication.             Pertinent Vitals/Pain Faces Pain Scale: Hurts little more Pain Location: R hip Pain Descriptors / Indicators: Grimacing;Moaning           PT Goals (current goals can now be found in the care plan section) Acute Rehab PT Goals Patient Stated Goal: to go home    Frequency    7X/week      PT Plan Current plan remains appropriate       AM-PAC PT "6 Clicks" Mobility   Outcome Measure  Help needed turning from your back to your side while in a flat bed without using bedrails?: A Lot Help needed moving from lying on your back to sitting on the side of a flat bed without using bedrails?: A Lot Help needed moving to and from a bed  to a chair (including a wheelchair)?: A Lot Help needed standing up from a chair using your arms (e.g., wheelchair or bedside chair)?: A Lot Help needed to walk in hospital room?: A Lot Help needed climbing 3-5 steps with a railing? : Total 6 Click Score: 11    End of Session Equipment Utilized During Treatment: Gait belt Activity Tolerance: Patient tolerated treatment well Patient left: in chair;with call bell/phone within reach;with chair alarm  set;with family/visitor present (supportive caregiver and pt's daughter present throughout session) Nurse Communication: Mobility status PT Visit Diagnosis: Unsteadiness on feet (R26.81);History of falling (Z91.81);Other abnormalities of gait and mobility (R26.89);Muscle weakness (generalized) (M62.81);Pain Pain - Right/Left: Right Pain - part of body: Hip     Time:  -     Charges:                        Jetta Lout PTA 05/02/20, 7:54 AM

## 2020-05-02 NOTE — Progress Notes (Signed)
Occupational Therapy Treatment Patient Details Name: Ashley Cantrell MRN: [id number] DOB: 06/28/1925 Today's Date: 05/02/2020    History of present illness Pt is a 87 y.o. female with medical history significant for hypothyroidism, HTN, type 2 diabetes, hearing impairment, osteoporosis with history of left hip fracture who presents by EMS following an unwitnessed fall sustaining injury to the right hip and right chin.  Pt diagnosed with displaced right subcapital hip fracture and is s/p R hip anterior hemiarthroplasty.   OT comments  Ashley Cantrell presents today more alert than in previous session, 2 days earlier. She displays no outward evidence of pain and is able to move her RLE surprisingly well during seated therex. She performed multiple sit<>stand with RW and CGA, no LOB observed. M IND in feeding, grooming. Extremely HOH but responds will to visual cues. DC to SNF remains appropriate plan for this pt, with pt's daughter in agreement.   Follow Up Recommendations  SNF    Equipment Recommendations       Recommendations for Other Services      Precautions / Restrictions Precautions Precautions: Fall;Anterior Hip Precaution Booklet Issued: Yes (comment) Restrictions Weight Bearing Restrictions: Yes RLE Weight Bearing: Weight bearing as tolerated       Mobility Bed Mobility Overal bed mobility: Needs Assistance Bed Mobility: Supine to Sit     Supine to sit: Min assist     General bed mobility comments: Pt received in/returned to recliner    Transfers Overall transfer level: Needs assistance Equipment used: Rolling walker (2 wheeled) Transfers: Sit to/from Stand Sit to Stand: Min guard         General transfer comment: CGA for safety to stand    Balance Overall balance assessment: Needs assistance Sitting-balance support: Bilateral upper extremity supported;Feet supported Sitting balance-Leahy Scale: Fair Sitting balance - Comments: Pt repeatedly slides down in  recliner   Standing balance support: Bilateral upper extremity supported;During functional activity Standing balance-Leahy Scale: Fair Standing balance comment: heavy reliance on RW, UE support                           ADL either performed or assessed with clinical judgement   ADL Overall ADL's : Needs assistance/impaired Eating/Feeding: Modified independent Eating/Feeding Details (indicate cue type and reason): Pt able to feed self, using hands rather then utensils Grooming: Wash/dry hands;Wash/dry face;Modified independent                                       Vision Patient Visual Report: No change from baseline     Perception     Praxis      Cognition Arousal/Alertness: Awake/alert Behavior During Therapy: WFL for tasks assessed/performed Overall Cognitive Status: History of cognitive impairments - at baseline                                 General Comments: Pt is extremely HOH. does better if you yell into L ear versus R ear. Responds well to visual cues.        Exercises Total Joint Exercises Ankle Circles/Pumps: AROM;Both;Seated;5 reps Long Arc Quad: AROM;Seated;10 reps;Both Knee Flexion: AROM;10 reps;Seated;Both Other Exercises Other Exercises: Provided educ to pt's daughter (via ASL interpreter) re D/C recs   Shoulder Instructions       General Comments  Pertinent Vitals/ Pain       Faces Pain Scale: Hurts a little bit Pain Location: R hip Pain Intervention(s): Limited activity within patient's tolerance;Repositioned;Monitored during session  Home Living                                          Prior Functioning/Environment              Frequency  Min 2X/week        Progress Toward Goals  OT Goals(current goals can now be found in the care plan section)  Progress towards OT goals: Not progressing toward goals - comment (pt to DC to [facility] for long-term placement)  Acute Rehab  OT Goals Patient Stated Goal: to go home OT Goal Formulation: With patient Time For Goal Achievement: 05/14/20 Potential to Achieve Goals: Poor  Plan Discharge plan remains appropriate    Co-evaluation                 AM-PAC OT "6 Clicks" Daily Activity     Outcome Measure   Help from another person eating meals?: None Help from another person taking care of personal grooming?: A Little Help from another person toileting, which includes using toliet, bedpan, or urinal?: A Lot Help from another person bathing (including washing, rinsing, drying)?: A Lot Help from another person to put on and taking off regular upper body clothing?: A Little Help from another person to put on and taking off regular lower body clothing?: A Little 6 Click Score: 17    End of Session Equipment Utilized During Treatment: Rolling walker  OT Visit Diagnosis: Unsteadiness on feet (R26.81);Muscle weakness (generalized) (M62.81)   Activity Tolerance Patient tolerated treatment well   Patient Left in chair;with chair alarm set;with family/visitor present;with call bell/phone within reach   Nurse Communication          Time: 1245-1300 OT Time Calculation (min): 15 min  Charges: OT General Charges $OT Visit: 1 Visit OT Treatments $Self Care/Home Management : 8-22 mins  Latina Craver, PhD, MS, OTR/L 05/02/20, 2:46 PM

## 2020-05-02 NOTE — Progress Notes (Signed)
PROGRESS NOTE    Ashley Cantrell  BMW:[id number] DOB: 1925/10/17 DOA: 04/27/2020 PCP: Medicine, [facility]   Assessment & Plan:   Principal Problem:   Closed displaced fracture of right femoral neck (HCC) Active Problems:   Essential hypertension   Hypothyroidism   Type 2 diabetes, diet controlled (HCC)   Hearing impairment   History of fracture of left hip   Osteoporosis   Unwitnessed fall   Preoperative clearance   Closed right hip fracture (HCC)   Closed displaced fracture right femoral neck: s/p right anterior hip hemiarthroplasty on 04/27/2020. Continue on aspirin for 30 days for DVT prophylaxis as per ortho surg. PT recs SNF  Acute blood loss anemia: H&H are labile. Will continue to monitor   Hx of dementia: continue w/ supportive care   HLD: continue on statin   Hypothyroidism: continue on levothyroxine   Hearing impairment: severe. Continue w/ supportive care  DVT prophylaxis: SCDs Code Status: DNR Family Communication:  Disposition Plan: SNF.Awaiting on SNF placement still   Level of care: Med-Surg   Status is: Inpatient  Remains inpatient appropriate because:Unsafe d/c plan and Inpatient level of care appropriate due to severity of illness   Dispo: The patient is from: Home              Anticipated d/c is to: SNF              Patient currently is medically stable for d/c    Difficult to place patient Yes        Consultants:      Procedures:    Antimicrobials:   Subjective: Pt c/o leg pain  Objective: Vitals:   05/01/20 1208 05/01/20 1942 05/01/20 2350 05/02/20 0411  BP: 112/64 133/78 108/62 (!) 125/57  Pulse: 72 92 89 72  Resp: 18 16 20 16   Temp: 98.5 F (36.9 C) 98.3 F (36.8 C) 98 F (36.7 C) 98 F (36.7 C)  TempSrc: Oral Oral Oral Oral  SpO2: 96% 100% 100% 100%  Weight:      Height:        Intake/Output Summary (Last 24 hours) at 05/02/2020 0752 Last data filed at 05/02/2020 0414 Gross per 24 hour  Intake 0 ml   Output 600 ml  Net -600 ml   Filed Weights   04/27/20 0114  Weight: 54 kg    Examination:  General exam: Appears comfortable. Talks loudly  Respiratory system: clear breath sounds b/l Cardiovascular system: S1 & S2+. No rubs or clicks  Gastrointestinal system: Abd is soft, NT, ND & normal bowel sounds  Central nervous system: Awake. Moves all extremities  Psychiatry: Judgement and insight appears abnormal. Flat mood and affect     Data Reviewed: I have personally reviewed following labs and imaging studies  CBC: Recent Labs  Lab 04/27/20 0254 04/28/20 0838 04/29/20 0640 04/30/20 0509 05/01/20 0601 05/02/20 0633  WBC 7.8 5.0 6.4 6.6 6.0 6.4  NEUTROABS 5.8 3.3 4.6 4.0  --   --   HGB 13.0 9.8* 11.6* 9.6* 11.4* 9.6*  HCT 38.0 29.0* 33.0* 28.8* 33.1* 28.4*  MCV 92.5 92.9 91.4 93.8 92.2 93.4  PLT 261 181 233 219 296 281   Basic Metabolic Panel: Recent Labs  Lab 04/27/20 0254 04/28/20 0838 04/29/20 0640 05/01/20 0601 05/02/20 0633  NA 134* 132* 132* 136 135  K 4.1 4.2 3.8 3.9 3.8  CL 99 102 100 102 103  CO2 25 25 25 27 25   GLUCOSE 127* 93 122* 123* 111*  BUN 21 20 20 21 18   CREATININE 0.93 0.89 0.89 0.79 0.89  CALCIUM 9.4 8.1* 8.5* 8.1* 8.1*   GFR: Estimated Creatinine Clearance: 34.4 mL/min (by C-G formula based on SCr of 0.89 mg/dL). Liver Function Tests: Recent Labs  Lab 04/27/20 0254  AST 32  ALT 21  ALKPHOS 61  BILITOT 0.9  PROT 7.5  ALBUMIN 4.0   No results for input(s): LIPASE, AMYLASE in the last 168 hours. No results for input(s): AMMONIA in the last 168 hours. Coagulation Profile: Recent Labs  Lab 04/27/20 0254  INR 1.0   Cardiac Enzymes: No results for input(s): CKTOTAL, CKMB, CKMBINDEX, TROPONINI in the last 168 hours. BNP (last 3 results) No results for input(s): PROBNP in the last 8760 hours. HbA1C: No results for input(s): HGBA1C in the last 72 hours. CBG: No results for input(s): GLUCAP in the last 168 hours. Lipid  Profile: No results for input(s): CHOL, HDL, LDLCALC, TRIG, CHOLHDL, LDLDIRECT in the last 72 hours. Thyroid Function Tests: No results for input(s): TSH, T4TOTAL, FREET4, T3FREE, THYROIDAB in the last 72 hours. Anemia Panel: No results for input(s): VITAMINB12, FOLATE, FERRITIN, TIBC, IRON, RETICCTPCT in the last 72 hours. Sepsis Labs: No results for input(s): PROCALCITON, LATICACIDVEN in the last 168 hours.  Recent Results (from the past 240 hour(s))  Resp Panel by RT-PCR (Flu A&B, Covid) Nasopharyngeal Swab     Status: None   Collection Time: 04/27/20  2:54 AM   Specimen: Nasopharyngeal Swab; Nasopharyngeal(NP) swabs in vial transport medium  Result Value Ref Range Status   SARS Coronavirus 2 by RT PCR NEGATIVE NEGATIVE Final    Comment: (NOTE) SARS-CoV-2 target nucleic acids are NOT DETECTED.  The SARS-CoV-2 RNA is generally detectable in upper respiratory specimens during the acute phase of infection. The lowest concentration of SARS-CoV-2 viral copies this assay can detect is 138 copies/mL. A negative result does not preclude SARS-Cov-2 infection and should not be used as the sole basis for treatment or other patient management decisions. A negative result may occur with  improper specimen collection/handling, submission of specimen other than nasopharyngeal swab, presence of viral mutation(s) within the areas targeted by this assay, and inadequate number of viral copies(<138 copies/mL). A negative result must be combined with clinical observations, patient history, and epidemiological information. The expected result is Negative.  Fact Sheet for Patients:  06/27/20  Fact Sheet for Healthcare Providers:  BloggerCourse.com  This test is no t yet approved or cleared by the [employer] FDA and  has been authorized for detection and/or diagnosis of SARS-CoV-2 by FDA under an Emergency Use Authorization (EUA). This EUA  will remain  in effect (meaning this test can be used) for the duration of the COVID-19 declaration under Section 564(b)(1) of the Act, 21 U.S.C.section 360bbb-3(b)(1), unless the authorization is terminated  or revoked sooner.       Influenza A by PCR NEGATIVE NEGATIVE Final   Influenza B by PCR NEGATIVE NEGATIVE Final    Comment: (NOTE) The Xpert Xpress SARS-CoV-2/FLU/RSV plus assay is intended as an aid in the diagnosis of influenza from Nasopharyngeal swab specimens and should not be used as a sole basis for treatment. Nasal washings and aspirates are unacceptable for Xpert Xpress SARS-CoV-2/FLU/RSV testing.  Fact Sheet for Patients: [place]  Fact Sheet for Healthcare Providers: BloggerCourse.com  This test is not yet approved or cleared by the [employer] FDA and has been authorized for detection and/or diagnosis of SARS-CoV-2 by FDA under an Emergency Use Authorization (EUA). This  EUA will remain in effect (meaning this test can be used) for the duration of the COVID-19 declaration under Section 564(b)(1) of the Act, 21 U.S.C. section 360bbb-3(b)(1), unless the authorization is terminated or revoked.  Performed at [facility], [address], [place], [place] [address]          Radiology Studies: No results found.      Scheduled Meds: . amLODipine  5 mg Oral Daily  . aspirin EC  81 mg Oral BID  . atorvastatin  20 mg Oral Daily  . Chlorhexidine Gluconate Cloth  6 each Topical Daily  . docusate sodium  100 mg Oral BID  . dorzolamide-timolol  1 drop Both Eyes BID  . levothyroxine  75 mcg Oral QAC breakfast  . tranexamic acid (CYKLOKAPRON) topical - INTRAOP  2,000 mg Topical Once   Continuous Infusions:   LOS: 5 days    Time spent: 31 mins    Charise Killian, MD [facility] Pager [phone]  If 7PM-7AM, please contact night-coverage 05/02/2020, 7:52 AM

## 2020-05-02 NOTE — Progress Notes (Signed)
Physical Therapy Treatment Patient Details Name: Ashley Cantrell MRN: [id number] DOB: 1925/09/09 Today's Date: 05/02/2020    History of Present Illness Pt is a 87 y.o. female with medical history significant for hypothyroidism, HTN, type 2 diabetes, hearing impairment, osteoporosis with history of left hip fracture who presents by EMS following an unwitnessed fall sustaining injury to the right hip and right chin.  Pt diagnosed with displaced right subcapital hip fracture and is s/p R hip anterior hemiarthroplasty.    PT Comments    Pt was asleep in supine with HOB elevated ~ 20 degrees. She is HOH but does better talking into L ear versus R ear. She was able to exit R side of bed, stand, and ambulate with RW. Does require min assist for most functional task for safety. Pt will greatly benefit from SNF at [place] to continue to improve gait, strength, and safety with functional mobility. She was sitting in recliner with call bell in reach and chair alarm in place. RN aware of pt's abilities.    Follow Up Recommendations  SNF     Equipment Recommendations  None recommended by PT    Recommendations for Other Services       Precautions / Restrictions Precautions Precautions: Fall;Anterior Hip Precaution Booklet Issued: Yes (comment) Restrictions Weight Bearing Restrictions: Yes RLE Weight Bearing: Weight bearing as tolerated    Mobility  Bed Mobility Overal bed mobility: Needs Assistance Bed Mobility: Supine to Sit     Supine to sit: Min assist     General bed mobility comments: increased time and constant tactle/verbal cues for sequencing    Transfers Overall transfer level: Needs assistance Equipment used: Rolling walker (2 wheeled) Transfers: Sit to/from Stand Sit to Stand: Min guard;From elevated surface         General transfer comment: CGA for safety to stand from slightly elevated bed height  Ambulation/Gait Ambulation/Gait assistance: Min assist;Min guard Gait  Distance (Feet): 30 Feet Assistive device: Rolling walker (2 wheeled) Gait Pattern/deviations: Step-to pattern;Decreased stance time - right;Decreased step length - left;Antalgic Gait velocity: decreased   General Gait Details: pt was able to ambulate to RN desk with w/c follow for safety. seated rest once at RN station prior to standing from w/c and ambulating back to recliner       Balance Overall balance assessment: Needs assistance Sitting-balance support: Bilateral upper extremity supported;Feet supported Sitting balance-Leahy Scale: Good     Standing balance support: Bilateral upper extremity supported;During functional activity Standing balance-Leahy Scale: Fair Standing balance comment: reliant on UE support however no LOB during gait activity         Cognition Arousal/Alertness: Awake/alert Behavior During Therapy: WFL for tasks assessed/performed Overall Cognitive Status: Difficult to assess          General Comments: Pt is extremely HOH. does better is you yell into L ear versus R ear.         General Comments General comments (skin integrity, edema, etc.): breakfast tray set up for pt and pt easily able to independently feed herself      Pertinent Vitals/Pain Faces Pain Scale: Hurts little more Pain Location: R hip Pain Descriptors / Indicators: Grimacing;Moaning Pain Intervention(s): Limited activity within patient's tolerance;Monitored during session;Premedicated before session;Repositioned           PT Goals (current goals can now be found in the care plan section) Acute Rehab PT Goals Patient Stated Goal: to go home Progress towards PT goals: Progressing toward goals    Frequency  7X/week      PT Plan Current plan remains appropriate       AM-PAC PT "6 Clicks" Mobility   Outcome Measure  Help needed turning from your back to your side while in a flat bed without using bedrails?: A Little Help needed moving from lying on your back  to sitting on the side of a flat bed without using bedrails?: A Little Help needed moving to and from a bed to a chair (including a wheelchair)?: A Lot Help needed standing up from a chair using your arms (e.g., wheelchair or bedside chair)?: A Lot Help needed to walk in hospital room?: A Little Help needed climbing 3-5 steps with a railing? : A Lot 6 Click Score: 15    End of Session Equipment Utilized During Treatment: Gait belt Activity Tolerance: Patient tolerated treatment well Patient left: in chair;with call bell/phone within reach;with chair alarm set Nurse Communication: Mobility status PT Visit Diagnosis: Unsteadiness on feet (R26.81);History of falling (Z91.81);Other abnormalities of gait and mobility (R26.89);Muscle weakness (generalized) (M62.81);Pain Pain - Right/Left: Right Pain - part of body: Hip     Time: 5183-4373 PT Time Calculation (min) (ACUTE ONLY): 30 min  Charges:  $Gait Training: 23-37 mins                     Jetta Lout PTA 05/02/20, 11:01 AM

## 2020-05-03 LAB — BASIC METABOLIC PANEL
Anion gap: 5 (ref 5–15)
BUN: 17 mg/dL (ref 8–23)
CO2: 23 mmol/L (ref 22–32)
Calcium: 7.9 mg/dL — ABNORMAL LOW (ref 8.9–10.3)
Chloride: 104 mmol/L (ref 98–111)
Creatinine, Ser: 0.87 mg/dL (ref 0.44–1.00)
GFR, Estimated: >60 mL/min (ref >60)
Glucose, Bld: 93 mg/dL (ref 70–99)
Potassium: 3.7 mmol/L (ref 3.5–5.1)
Sodium: 132 mmol/L — ABNORMAL LOW (ref 135–145)

## 2020-05-03 LAB — CBC
HCT: 28.3 % — ABNORMAL LOW (ref 36.0–46.0)
Hemoglobin: 9.9 g/dL — ABNORMAL LOW (ref 12.0–15.0)
MCH: 32.5 pg (ref 26.0–34.0)
MCHC: 35 g/dL (ref 30.0–36.0)
MCV: 92.8 fL (ref 80.0–100.0)
Platelets: 297 10*3/uL (ref 150–400)
RBC: 3.05 MIL/uL — ABNORMAL LOW (ref 3.87–5.11)
RDW: 15 % (ref 11.5–15.5)
WBC: 6.4 10*3/uL (ref 4.0–10.5)
nRBC: 0 % (ref 0.0–0.2)

## 2020-05-03 NOTE — Progress Notes (Signed)
Physical Therapy Treatment Patient Details Name: Ashley Cantrell MRN: [id number] DOB: Jul 08, 1925 Today's Date: 05/03/2020    History of Present Illness Pt is a 87 y.o. female with medical history significant for hypothyroidism, HTN, type 2 diabetes, hearing impairment, osteoporosis with history of left hip fracture who presents by EMS following an unwitnessed fall sustaining injury to the right hip and right chin.  Pt diagnosed with displaced right subcapital hip fracture and is s/p R hip anterior hemiarthroplasty.    PT Comments    The pt initially presents tearful this session d/t missing her daughter. She is consolable and agreeable to PT this session. She continues to state that "I can't walk" although she participate in gait training on previous PT session. The pt demonstrates increased independence with bed mobility this session and reports decreased pain throughout session. She is willing to attempt standing, but is not agreeable to gait training. PT will continue to follow.    Follow Up Recommendations  SNF     Equipment Recommendations  None recommended by PT    Recommendations for Other Services       Precautions / Restrictions Precautions Precautions: Fall;Anterior Hip Restrictions Weight Bearing Restrictions: Yes RLE Weight Bearing: Weight bearing as tolerated    Mobility  Bed Mobility Overal bed mobility: Modified Independent Bed Mobility: Supine to Sit     Supine to sit: Modified independent (Device/Increase time);HOB elevated     General bed mobility comments: With use of bed railings and increased time the pt was able to achieve EOB without physical assistance.    Transfers Overall transfer level: Needs assistance Equipment used: Rolling walker (2 wheeled) Transfers: Sit to/from Stand Sit to Stand: Min guard         General transfer comment: Pt requiring assistance for safety only.  Ambulation/Gait             General Gait Details: Pt tearful  about the inability to walk, unable to recall ambulation at last session. Not agreeable to attempt ambulation this session.   Stairs             Wheelchair Mobility    Modified Rankin (Stroke Patients Only)       Balance Overall balance assessment: Needs assistance Sitting-balance support: Bilateral upper extremity supported;Feet supported Sitting balance-Leahy Scale: Good     Standing balance support: Bilateral upper extremity supported;During functional activity Standing balance-Leahy Scale: Fair                              Cognition Arousal/Alertness: Awake/alert Behavior During Therapy: WFL for tasks assessed/performed Overall Cognitive Status: History of cognitive impairments - at baseline                                 General Comments: Does best if you write down information. Initially tearful upon entry d/t not seeing daughter today. She is able to be consoled and agreeable to PT.      Exercises Total Joint Exercises Hip ABduction/ADduction: AAROM;Right;10 reps;Supine Straight Leg Raises: Right;AAROM;10 reps;Supine Knee Flexion: Right;10 reps;Supine;AAROM    General Comments        Pertinent Vitals/Pain Pain Assessment: No/denies pain Pain Location: Pt states "I thank the Lord I am not in pain." Pain Intervention(s): Monitored during session;Repositioned    Home Living  Prior Function            PT Goals (current goals can now be found in the care plan section) Acute Rehab PT Goals Patient Stated Goal: to go home PT Goal Formulation: Patient unable to participate in goal setting Time For Goal Achievement: 05/12/20 Potential to Achieve Goals: Fair Progress towards PT goals: Progressing toward goals    Frequency    7X/week      PT Plan Current plan remains appropriate    Co-evaluation              AM-PAC PT "6 Clicks" Mobility   Outcome Measure  Help needed turning  from your back to your side while in a flat bed without using bedrails?: None Help needed moving from lying on your back to sitting on the side of a flat bed without using bedrails?: None Help needed moving to and from a bed to a chair (including a wheelchair)?: A Little Help needed standing up from a chair using your arms (e.g., wheelchair or bedside chair)?: A Little Help needed to walk in hospital room?: A Little Help needed climbing 3-5 steps with a railing? : A Lot 6 Click Score: 19    End of Session   Activity Tolerance: Patient tolerated treatment well Patient left: in chair;with call bell/phone within reach;with chair alarm set Nurse Communication: Mobility status PT Visit Diagnosis: Unsteadiness on feet (R26.81);History of falling (Z91.81);Other abnormalities of gait and mobility (R26.89);Muscle weakness (generalized) (M62.81);Pain     Time: 3716-9678 PT Time Calculation (min) (ACUTE ONLY): 41 min  Charges:  $Therapeutic Exercise: 38-52 mins                     3:39 PM, 05/03/20 Kallen Mccrystal A. Mordecai Maes PT, DPT Physical Therapist - [facility] A Saori Umholtz 05/03/2020, 3:34 PM

## 2020-05-03 NOTE — Progress Notes (Signed)
  Subjective:  POD #6 s/p right hip hemiarthroplasty.   Patient was sleeping comfortably upon arrival to the room.  Patient is hard of hearing and did not provide history today.    Objective:   VITALS:   Vitals:   05/02/20 2323 05/03/20 0421 05/03/20 0735 05/03/20 1135  BP: (!) 131/55 122/61 (!) 135/52 132/68  Pulse: 65 77 68 77  Resp: 20 17 17 18   Temp: 97.9 F (36.6 C) 98.8 F (37.1 C) 98.6 F (37 C) 98.9 F (37.2 C)  TempSrc: Oral     SpO2: 100% 98% 99% 99%  Weight:      Height:        PHYSICAL EXAM: Right lower extremity: Neurovascular intact Sensation intact distally Intact pulses distally Dorsiflexion/Plantar flexion intact Incision: scant drainage No cellulitis present Compartment soft  LABS  Results for orders placed or performed during the hospital encounter of 04/27/20 (from the past 24 hour(s))  CBC     Status: Abnormal   Collection Time: 05/03/20  5:10 AM  Result Value Ref Range   WBC 6.4 4.0 - 10.5 K/uL   RBC 3.05 (L) 3.87 - 5.11 MIL/uL   Hemoglobin 9.9 (L) 12.0 - 15.0 g/dL   HCT 07/03/20 (L) 98.9 - 21.1 %   MCV 92.8 80.0 - 100.0 fL   MCH 32.5 26.0 - 34.0 pg   MCHC 35.0 30.0 - 36.0 g/dL   RDW 94.1 74.0 - 81.4 %   Platelets 297 150 - 400 K/uL   nRBC 0.0 0.0 - 0.2 %  Basic metabolic panel     Status: Abnormal   Collection Time: 05/03/20  5:10 AM  Result Value Ref Range   Sodium 132 (L) 135 - 145 mmol/L   Potassium 3.7 3.5 - 5.1 mmol/L   Chloride 104 98 - 111 mmol/L   CO2 23 22 - 32 mmol/L   Glucose, Bld 93 70 - 99 mg/dL   BUN 17 8 - 23 mg/dL   Creatinine, Ser 07/03/20 0.44 - 1.00 mg/dL   Calcium 7.9 (L) 8.9 - 10.3 mg/dL   GFR, Estimated 8.56 >31 mL/min   Anion gap 5 5 - 15    No results found.  Assessment/Plan: 6 Days Post-Op   Principal Problem:   Closed displaced fracture of right femoral neck (HCC) Active Problems:   Essential hypertension   Hypothyroidism   Type 2 diabetes, diet controlled (HCC)   Hearing impairment   History of  fracture of left hip   Osteoporosis   Unwitnessed fall   Preoperative clearance   Closed right hip fracture (HCC)  Up with therapy WBAT RLE Aspirin 81 mg twice daily for 30 days D/C per hospitialist     >49 , MD 05/03/2020, 12:03 PM

## 2020-05-03 NOTE — Progress Notes (Signed)
PROGRESS NOTE    Ashley Cantrell  JGO:[id number] DOB: 27-Feb-1925 DOA: 04/27/2020 PCP: Medicine, [facility]   Assessment & Plan:   Principal Problem:   Closed displaced fracture of right femoral neck (HCC) Active Problems:   Essential hypertension   Hypothyroidism   Type 2 diabetes, diet controlled (HCC)   Hearing impairment   History of fracture of left hip   Osteoporosis   Unwitnessed fall   Preoperative clearance   Closed right hip fracture (HCC)   Closed displaced fracture right femoral neck: s/p right anterior hip hemiarthroplasty on 04/27/2020. Continue on aspirin for 30 days for DVT prophylaxis as per ortho surg. PT recs SNF  Acute blood loss anemia: H&H are stable. No need for a transfusion currently   Hx of dementia: continue w/ supportive care   HLD: continue on statin   Hypothyroidism: continue on levothyroxine    Hearing impairment: severe. Continue w/ supportive care   DVT prophylaxis: SCDs Code Status: DNR Family Communication:  Disposition Plan: SNF.Awaiting on SNF placement still   Level of care: Med-Surg   Status is: Inpatient  Remains inpatient appropriate because:Unsafe d/c plan and Inpatient level of care appropriate due to severity of illness awaiting SNF placement still    Dispo: The patient is from: Home              Anticipated d/c is to: SNF              Patient currently is medically stable for d/c    Difficult to place patient Yes        Consultants:      Procedures:    Antimicrobials:   Subjective: Pt c/o weakness   Objective: Vitals:   05/02/20 1959 05/02/20 2323 05/03/20 0421 05/03/20 0735  BP: (!) 142/57 (!) 131/55 122/61 (!) 135/52  Pulse: 77 65 77 68  Resp: 16 20 17 17   Temp: 98.8 F (37.1 C) 97.9 F (36.6 C) 98.8 F (37.1 C) 98.6 F (37 C)  TempSrc:  Oral    SpO2: 98% 100% 98% 99%  Weight:      Height:        Intake/Output Summary (Last 24 hours) at 05/03/2020 0754 Last data filed at  05/02/2020 1945 Gross per 24 hour  Intake --  Output 400 ml  Net -400 ml   Filed Weights   04/27/20 0114  Weight: 54 kg    Examination:  General exam: Appears comfortable  Respiratory system: clear breath sounds b/l Cardiovascular system: S1 &S2+. No rubs or clicks  Gastrointestinal system: Abd is soft, NT, ND & hypoactive bowel sounds  Central nervous system: Alert and awake. Moves all extremities   Psychiatry: Judgement and insight appear normal. Appropriate mood and affect    Data Reviewed: I have personally reviewed following labs and imaging studies  CBC: Recent Labs  Lab 04/27/20 0254 04/28/20 0838 04/29/20 0640 04/30/20 0509 05/01/20 0601 05/02/20 0633 05/03/20 0510  WBC 7.8 5.0 6.4 6.6 6.0 6.4 6.4  NEUTROABS 5.8 3.3 4.6 4.0  --   --   --   HGB 13.0 9.8* 11.6* 9.6* 11.4* 9.6* 9.9*  HCT 38.0 29.0* 33.0* 28.8* 33.1* 28.4* 28.3*  MCV 92.5 92.9 91.4 93.8 92.2 93.4 92.8  PLT 261 181 233 219 296 281 297   Basic Metabolic Panel: Recent Labs  Lab 04/28/20 0838 04/29/20 0640 05/01/20 0601 05/02/20 0633 05/03/20 0510  NA 132* 132* 136 135 132*  K 4.2 3.8 3.9 3.8 3.7  CL 102  100 102 103 104  CO2 25 25 27 25 23   GLUCOSE 93 122* 123* 111* 93  BUN 20 20 21 18 17   CREATININE 0.89 0.89 0.79 0.89 0.87  CALCIUM 8.1* 8.5* 8.1* 8.1* 7.9*   GFR: Estimated Creatinine Clearance: 35.2 mL/min (by C-G formula based on SCr of 0.87 mg/dL). Liver Function Tests: Recent Labs  Lab 04/27/20 0254  AST 32  ALT 21  ALKPHOS 61  BILITOT 0.9  PROT 7.5  ALBUMIN 4.0   No results for input(s): LIPASE, AMYLASE in the last 168 hours. No results for input(s): AMMONIA in the last 168 hours. Coagulation Profile: Recent Labs  Lab 04/27/20 0254  INR 1.0   Cardiac Enzymes: No results for input(s): CKTOTAL, CKMB, CKMBINDEX, TROPONINI in the last 168 hours. BNP (last 3 results) No results for input(s): PROBNP in the last 8760 hours. HbA1C: No results for input(s): HGBA1C in the  last 72 hours. CBG: No results for input(s): GLUCAP in the last 168 hours. Lipid Profile: No results for input(s): CHOL, HDL, LDLCALC, TRIG, CHOLHDL, LDLDIRECT in the last 72 hours. Thyroid Function Tests: No results for input(s): TSH, T4TOTAL, FREET4, T3FREE, THYROIDAB in the last 72 hours. Anemia Panel: No results for input(s): VITAMINB12, FOLATE, FERRITIN, TIBC, IRON, RETICCTPCT in the last 72 hours. Sepsis Labs: No results for input(s): PROCALCITON, LATICACIDVEN in the last 168 hours.  Recent Results (from the past 240 hour(s))  Resp Panel by RT-PCR (Flu A&B, Covid) Nasopharyngeal Swab     Status: None   Collection Time: 04/27/20  2:54 AM   Specimen: Nasopharyngeal Swab; Nasopharyngeal(NP) swabs in vial transport medium  Result Value Ref Range Status   SARS Coronavirus 2 by RT PCR NEGATIVE NEGATIVE Final    Comment: (NOTE) SARS-CoV-2 target nucleic acids are NOT DETECTED.  The SARS-CoV-2 RNA is generally detectable in upper respiratory specimens during the acute phase of infection. The lowest concentration of SARS-CoV-2 viral copies this assay can detect is 138 copies/mL. A negative result does not preclude SARS-Cov-2 infection and should not be used as the sole basis for treatment or other patient management decisions. A negative result may occur with  improper specimen collection/handling, submission of specimen other than nasopharyngeal swab, presence of viral mutation(s) within the areas targeted by this assay, and inadequate number of viral copies(<138 copies/mL). A negative result must be combined with clinical observations, patient history, and epidemiological information. The expected result is Negative.  Fact Sheet for Patients:  06/27/20  Fact Sheet for Healthcare Providers:  06/27/20  This test is no t yet approved or cleared by the [employer] FDA and  has been authorized for detection and/or  diagnosis of SARS-CoV-2 by FDA under an Emergency Use Authorization (EUA). This EUA will remain  in effect (meaning this test can be used) for the duration of the COVID-19 declaration under Section 564(b)(1) of the Act, 21 U.S.C.section 360bbb-3(b)(1), unless the authorization is terminated  or revoked sooner.       Influenza A by PCR NEGATIVE NEGATIVE Final   Influenza B by PCR NEGATIVE NEGATIVE Final    Comment: (NOTE) The Xpert Xpress SARS-CoV-2/FLU/RSV plus assay is intended as an aid in the diagnosis of influenza from Nasopharyngeal swab specimens and should not be used as a sole basis for treatment. Nasal washings and aspirates are unacceptable for Xpert Xpress SARS-CoV-2/FLU/RSV testing.  Fact Sheet for Patients: [employer]it  Fact Sheet for Healthcare Providers: [place]  This test is not yet approved or cleared by the [employer] FDA  and has been authorized for detection and/or diagnosis of SARS-CoV-2 by FDA under an Emergency Use Authorization (EUA). This EUA will remain in effect (meaning this test can be used) for the duration of the COVID-19 declaration under Section 564(b)(1) of the Act, 21 U.S.C. section 360bbb-3(b)(1), unless the authorization is terminated or revoked.  Performed at [facility], [address] [place], [place] [address]          Radiology Studies: No results found.      Scheduled Meds: . amLODipine  5 mg Oral Daily  . aspirin EC  81 mg Oral BID  . atorvastatin  20 mg Oral Daily  . Chlorhexidine Gluconate Cloth  6 each Topical Daily  . docusate sodium  100 mg Oral BID  . dorzolamide-timolol  1 drop Both Eyes BID  . levothyroxine  75 mcg Oral QAC breakfast  . tranexamic acid (CYKLOKAPRON) topical - INTRAOP  2,000 mg Topical Once   Continuous Infusions:   LOS: 6 days    Time spent: 25 mins    Charise Killian, MD [facility] Pager  [phone]  If 7PM-7AM, please contact night-coverage 05/03/2020, 7:54 AM

## 2020-05-04 LAB — BASIC METABOLIC PANEL
Anion gap: 7 (ref 5–15)
BUN: 19 mg/dL (ref 8–23)
CO2: 23 mmol/L (ref 22–32)
Calcium: 8.2 mg/dL — ABNORMAL LOW (ref 8.9–10.3)
Chloride: 105 mmol/L (ref 98–111)
Creatinine, Ser: 0.8 mg/dL (ref 0.44–1.00)
GFR, Estimated: >60 mL/min (ref >60)
Glucose, Bld: 71 mg/dL (ref 70–99)
Potassium: 4.1 mmol/L (ref 3.5–5.1)
Sodium: 135 mmol/L (ref 135–145)

## 2020-05-04 LAB — CBC
HCT: 29.9 % — ABNORMAL LOW (ref 36.0–46.0)
Hemoglobin: 10.4 g/dL — ABNORMAL LOW (ref 12.0–15.0)
MCH: 32 pg (ref 26.0–34.0)
MCHC: 34.8 g/dL (ref 30.0–36.0)
MCV: 92 fL (ref 80.0–100.0)
Platelets: 361 10*3/uL (ref 150–400)
RBC: 3.25 MIL/uL — ABNORMAL LOW (ref 3.87–5.11)
RDW: 15 % (ref 11.5–15.5)
WBC: 6 10*3/uL (ref 4.0–10.5)
nRBC: 0 % (ref 0.0–0.2)

## 2020-05-04 NOTE — Plan of Care (Signed)
  Problem: Clinical Measurements: Goal: Will remain free from infection Outcome: Progressing   Problem: Clinical Measurements: Goal: Ability to maintain clinical measurements within normal limits will improve Outcome: Progressing   Problem: Education: Goal: Knowledge of General Education information will improve Description: Including pain rating scale, medication(s)/side effects and non-pharmacologic comfort measures Outcome: Progressing   

## 2020-05-04 NOTE — Progress Notes (Signed)
Physical Therapy Treatment Patient Details Name: Ashley Cantrell MRN: [id number] DOB: 1925/12/21 Today's Date: 05/04/2020    History of Present Illness Pt is a 87 y.o. female with medical history significant for hypothyroidism, HTN, type 2 diabetes, hearing impairment, osteoporosis with history of left hip fracture who presents by EMS following an unwitnessed fall sustaining injury to the right hip and right chin.  Pt diagnosed with displaced right subcapital hip fracture and is s/p R hip anterior hemiarthroplasty.    PT Comments    Pt needing to use bathroom upon arrival.  Gets to EOB without assist.  Stood with min guard/assist and is able to walk to commode then around bed to recliner with min guard/assist.  She struggles in bathroom for a medium hard BM with c/o constipation.  Formed stood remained low in rectum.  RN aware.     Follow Up Recommendations  SNF     Equipment Recommendations       Recommendations for Other Services       Precautions / Restrictions Precautions Precautions: Fall;Anterior Hip Restrictions Weight Bearing Restrictions: Yes RLE Weight Bearing: Weight bearing as tolerated    Mobility  Bed Mobility Overal bed mobility: Modified Independent                  Transfers Overall transfer level: Needs assistance Equipment used: Rolling walker (2 wheeled) Transfers: Sit to/from Stand Sit to Stand: Min guard;Min assist            Ambulation/Gait Ambulation/Gait assistance: Min assist;Min guard Gait Distance (Feet): 30 Feet Assistive device: Rolling walker (2 wheeled) Gait Pattern/deviations: Step-to pattern;Decreased stance time - right;Decreased step length - left;Antalgic Gait velocity: decreased   General Gait Details: to bathroom then back around bed to recliner   Stairs             Wheelchair Mobility    Modified Rankin (Stroke Patients Only)       Balance Overall balance assessment: Needs assistance Sitting-balance  support: Bilateral upper extremity supported;Feet supported Sitting balance-Leahy Scale: Good     Standing balance support: Bilateral upper extremity supported;During functional activity Standing balance-Leahy Scale: Fair Standing balance comment: heavy reliance on RW, UE support                            Cognition Arousal/Alertness: Awake/alert Behavior During Therapy: WFL for tasks assessed/performed Overall Cognitive Status: History of cognitive impairments - at baseline                                        Exercises Other Exercises Other Exercises: to commode to void and BM.    General Comments        Pertinent Vitals/Pain Pain Assessment: No/denies pain    Home Living                      Prior Function            PT Goals (current goals can now be found in the care plan section) Progress towards PT goals: Progressing toward goals    Frequency    7X/week      PT Plan Current plan remains appropriate    Co-evaluation              AM-PAC PT "6 Clicks" Mobility   Outcome Measure  Help needed turning  from your back to your side while in a flat bed without using bedrails?: None Help needed moving from lying on your back to sitting on the side of a flat bed without using bedrails?: None Help needed moving to and from a bed to a chair (including a wheelchair)?: A Little Help needed standing up from a chair using your arms (e.g., wheelchair or bedside chair)?: A Little Help needed to walk in hospital room?: A Little Help needed climbing 3-5 steps with a railing? : A Lot 6 Click Score: 19    End of Session Equipment Utilized During Treatment: Gait belt Activity Tolerance: Patient tolerated treatment well Patient left: in chair;with call bell/phone within reach;with chair alarm set Nurse Communication: Mobility status;Other (comment) PT Visit Diagnosis: Unsteadiness on feet (R26.81);History of falling  (Z91.81);Other abnormalities of gait and mobility (R26.89);Muscle weakness (generalized) (M62.81);Pain Pain - Right/Left: Right Pain - part of body: Hip     Time: 3710-6269 PT Time Calculation (min) (ACUTE ONLY): 23 min  Charges:  $Gait Training: 8-22 mins $Therapeutic Activity: 8-22 mins                    Danielle Dess, PTA 05/04/20, 10:26 AM

## 2020-05-04 NOTE — Progress Notes (Signed)
PROGRESS NOTE    Ashley Cantrell  IOE:[id number] DOB: April 15, 1925 DOA: 04/27/2020 PCP: [facility]   Assessment & Plan:   Principal Problem:   Closed displaced fracture of right femoral neck (HCC) Active Problems:   Essential hypertension   Hypothyroidism   Type 2 diabetes, diet controlled (HCC)   Hearing impairment   History of fracture of left hip   Osteoporosis   Unwitnessed fall   Preoperative clearance   Closed right hip fracture (HCC)   Closed displaced fracture right femoral neck: s/p right anterior hip hemiarthroplasty on 04/27/2020. Continue on aspirin for 30 days for DVT prophylaxis as per ortho surg. PT recs SNF  Acute blood loss anemia: H&H are trending up today   Hx of dementia: continue w/ supportive care   HLD: continue on statin   Hypothyroidism: continue on synthroid    Hearing impairment: severe. Continue w/ supportive care  DVT prophylaxis: SCDs Code Status: DNR Family Communication:  Disposition Plan:  Still waiting on SNF placement   Level of care: Med-Surg   Status is: Inpatient  Remains inpatient appropriate because:Unsafe d/c plan and Inpatient level of care appropriate due to severity of illness awaiting SNF placement still    Dispo: The patient is from: Home              Anticipated d/c is to: SNF              Patient currently is medically stable for d/c    Difficult to place patient Yes        Consultants:      Procedures:    Antimicrobials:   Subjective: Pt c/o fatigue  Objective: Vitals:   05/03/20 1555 05/03/20 2025 05/04/20 0010 05/04/20 0544  BP: (!) 124/53 132/63 123/64 136/65  Pulse: 74 76 79 79  Resp: 17 16 16 16   Temp: 98.4 F (36.9 C) (!) 97.5 F (36.4 C) 98.7 F (37.1 C) 98.4 F (36.9 C)  TempSrc:  Oral  Oral  SpO2: 100% 98% 99% 99%  Weight:      Height:        Intake/Output Summary (Last 24 hours) at 05/04/2020 0747 Last data filed at 05/04/2020 0550 Gross per 24 hour  Intake  240 ml  Output 1550 ml  Net -1310 ml   Filed Weights   04/27/20 0114  Weight: 54 kg    Examination:  General exam: Appears calm & comfortable  Respiratory system: clear breath sounds b/l  Cardiovascular system: S1/S2+. No clicks or gallops Gastrointestinal system: Abd is soft, NT, ND & hypoactive bowel sounds   Central nervous system: Alert and awake. Moves all exterminities Psychiatry: judgement and insight appear abnormal. Flat mood and affect     Data Reviewed: I have personally reviewed following labs and imaging studies  CBC: Recent Labs  Lab 04/28/20 0838 04/29/20 0640 04/30/20 0509 05/01/20 0601 05/02/20 0633 05/03/20 0510 05/04/20 0504  WBC 5.0 6.4 6.6 6.0 6.4 6.4 6.0  NEUTROABS 3.3 4.6 4.0  --   --   --   --   HGB 9.8* 11.6* 9.6* 11.4* 9.6* 9.9* 10.4*  HCT 29.0* 33.0* 28.8* 33.1* 28.4* 28.3* 29.9*  MCV 92.9 91.4 93.8 92.2 93.4 92.8 92.0  PLT 181 233 219 296 281 297 361   Basic Metabolic Panel: Recent Labs  Lab 04/29/20 0640 05/01/20 0601 05/02/20 0633 05/03/20 0510 05/04/20 0504  NA 132* 136 135 132* 135  K 3.8 3.9 3.8 3.7 4.1  CL 100 102 103  104 105  CO2 25 27 25 23 23   GLUCOSE 122* 123* 111* 93 71  BUN 20 21 18 17 19   CREATININE 0.89 0.79 0.89 0.87 0.80  CALCIUM 8.5* 8.1* 8.1* 7.9* 8.2*   GFR: Estimated Creatinine Clearance: 38.3 mL/min (by C-G formula based on SCr of 0.8 mg/dL). Liver Function Tests: No results for input(s): AST, ALT, ALKPHOS, BILITOT, PROT, ALBUMIN in the last 168 hours. No results for input(s): LIPASE, AMYLASE in the last 168 hours. No results for input(s): AMMONIA in the last 168 hours. Coagulation Profile: No results for input(s): INR, PROTIME in the last 168 hours. Cardiac Enzymes: No results for input(s): CKTOTAL, CKMB, CKMBINDEX, TROPONINI in the last 168 hours. BNP (last 3 results) No results for input(s): PROBNP in the last 8760 hours. HbA1C: No results for input(s): HGBA1C in the last 72 hours. CBG: No  results for input(s): GLUCAP in the last 168 hours. Lipid Profile: No results for input(s): CHOL, HDL, LDLCALC, TRIG, CHOLHDL, LDLDIRECT in the last 72 hours. Thyroid Function Tests: No results for input(s): TSH, T4TOTAL, FREET4, T3FREE, THYROIDAB in the last 72 hours. Anemia Panel: No results for input(s): VITAMINB12, FOLATE, FERRITIN, TIBC, IRON, RETICCTPCT in the last 72 hours. Sepsis Labs: No results for input(s): PROCALCITON, LATICACIDVEN in the last 168 hours.  Recent Results (from the past 240 hour(s))  Resp Panel by RT-PCR (Flu A&B, Covid) Nasopharyngeal Swab     Status: None   Collection Time: 04/27/20  2:54 AM   Specimen: Nasopharyngeal Swab; Nasopharyngeal(NP) swabs in vial transport medium  Result Value Ref Range Status   SARS Coronavirus 2 by RT PCR NEGATIVE NEGATIVE Final    Comment: (NOTE) SARS-CoV-2 target nucleic acids are NOT DETECTED.  The SARS-CoV-2 RNA is generally detectable in upper respiratory specimens during the acute phase of infection. The lowest concentration of SARS-CoV-2 viral copies this assay can detect is 138 copies/mL. A negative result does not preclude SARS-Cov-2 infection and should not be used as the sole basis for treatment or other patient management decisions. A negative result may occur with  improper specimen collection/handling, submission of specimen other than nasopharyngeal swab, presence of viral mutation(s) within the areas targeted by this assay, and inadequate number of viral copies(<138 copies/mL). A negative result must be combined with clinical observations, patient history, and epidemiological information. The expected result is Negative.  Fact Sheet for Patients:   Fact Sheet for Healthcare Providers:  06/27/20  This test is no t yet approved or cleared by the [employer] FDA and  has been authorized for detection and/or diagnosis of SARS-CoV-2  by FDA under an Emergency Use Authorization (EUA). This EUA will remain  in effect (meaning this test can be used) for the duration of the COVID-19 declaration under Section 564(b)(1) of the Act, 21 U.S.C.section 360bbb-3(b)(1), unless the authorization is terminated  or revoked sooner.       Influenza A by PCR NEGATIVE NEGATIVE Final   Influenza B by PCR NEGATIVE NEGATIVE Final    Comment: (NOTE) The Xpert Xpress SARS-CoV-2/FLU/RSV plus assay is intended as an aid in the diagnosis of influenza from Nasopharyngeal swab specimens and should not be used as a sole basis for treatment. Nasal washings and aspirates are unacceptable for Xpert Xpress SARS-CoV-2/FLU/RSV testing.  Fact Sheet for Patients: SeriousBroker.it  Fact Sheet for Healthcare Providers: [place]  This test is not yet approved or cleared by the [employer] FDA and has been authorized for detection and/or diagnosis of SARS-CoV-2 by FDA under  an Emergency Use Authorization (EUA). This EUA will remain in effect (meaning this test can be used) for the duration of the COVID-19 declaration under Section 564(b)(1) of the Act, 21 U.S.C. section 360bbb-3(b)(1), unless the authorization is terminated or revoked.  Performed at [facility], [address], [place], [place] [address]          Radiology Studies: No results found.      Scheduled Meds: . amLODipine  5 mg Oral Daily  . aspirin EC  81 mg Oral BID  . atorvastatin  20 mg Oral Daily  . Chlorhexidine Gluconate Cloth  6 each Topical Daily  . docusate sodium  100 mg Oral BID  . dorzolamide-timolol  1 drop Both Eyes BID  . levothyroxine  75 mcg Oral QAC breakfast  . tranexamic acid (CYKLOKAPRON) topical - INTRAOP  2,000 mg Topical Once   Continuous Infusions:   LOS: 7 days    Time spent: 23 mins    Charise Killian, MD [facility] Pager [phone]  If 7PM-7AM,  please contact night-coverage 05/04/2020, 7:47 AM

## 2020-05-05 LAB — BASIC METABOLIC PANEL
Anion gap: 6 (ref 5–15)
BUN: 23 mg/dL (ref 8–23)
CO2: 25 mmol/L (ref 22–32)
Calcium: 8.2 mg/dL — ABNORMAL LOW (ref 8.9–10.3)
Chloride: 103 mmol/L (ref 98–111)
Creatinine, Ser: 0.88 mg/dL (ref 0.44–1.00)
GFR, Estimated: >60 mL/min (ref >60)
Glucose, Bld: 97 mg/dL (ref 70–99)
Potassium: 4.1 mmol/L (ref 3.5–5.1)
Sodium: 134 mmol/L — ABNORMAL LOW (ref 135–145)

## 2020-05-05 LAB — CBC
HCT: 29.3 % — ABNORMAL LOW (ref 36.0–46.0)
Hemoglobin: 9.7 g/dL — ABNORMAL LOW (ref 12.0–15.0)
MCH: 30.7 pg (ref 26.0–34.0)
MCHC: 33.1 g/dL (ref 30.0–36.0)
MCV: 92.7 fL (ref 80.0–100.0)
Platelets: 384 10*3/uL (ref 150–400)
RBC: 3.16 MIL/uL — ABNORMAL LOW (ref 3.87–5.11)
RDW: 14.7 % (ref 11.5–15.5)
WBC: 6.2 10*3/uL (ref 4.0–10.5)
nRBC: 0 % (ref 0.0–0.2)

## 2020-05-05 LAB — SARS CORONAVIRUS 2 (TAT 6-24 HRS): SARS Coronavirus 2: NEGATIVE

## 2020-05-05 MED ORDER — ADULT MULTIVITAMIN W/MINERALS CH
1.0000 | ORAL_TABLET | Freq: Every day | ORAL | Status: DC
  Administered 2020-05-05 – 2020-05-06 (×2): 1 via ORAL
  Filled 2020-05-05 (×2): qty 1

## 2020-05-05 NOTE — Progress Notes (Signed)
PROGRESS NOTE    Ashley Cantrell  WGN:[id number] DOB: 1925/05/12 DOA: 04/27/2020 PCP: [facility]   Assessment & Plan:   Principal Problem:   Closed displaced fracture of right femoral neck (HCC) Active Problems:   Essential hypertension   Hypothyroidism   Type 2 diabetes, diet controlled (HCC)   Hearing impairment   History of fracture of left hip   Osteoporosis   Unwitnessed fall   Preoperative clearance   Closed right hip fracture (HCC)   Closed displaced fracture right femoral neck: s/p right anterior hip hemiarthroplasty on 04/27/20. Continue on aspirin for 30 days for DVT prophylaxis as per ortho surg. PT recs SNF   Acute blood loss anemia: H&H are labile. No need for a transfusion currently   Hx of dementia: continue w/ supportive care  HLD: continue on statin   Hypothyroidism: continue on levothyroxine   Hearing impairment: severe. Continue w/ supportive care  DVT prophylaxis: SCDs Code Status: DNR Family Communication:  Disposition Plan:  Waiting on insurance auth for SNF placement   Level of care: Med-Surg   Status is: Inpatient  Remains inpatient appropriate because:Unsafe d/c plan and Inpatient level of care appropriate due to severity of illness , awaiting on insurance auth for SNF placement still    Dispo: The patient is from: Home              Anticipated d/c is to: SNF              Patient currently is medically stable for d/c    Difficult to place patient Yes        Consultants:      Procedures:    Antimicrobials:   Subjective: Pt c/o malaise   Objective: Vitals:   05/04/20 1955 05/04/20 2326 05/05/20 0417 05/05/20 0907  BP: 121/81 133/60 123/66 (!) 149/55  Pulse: 83 71 71 82  Resp: 20 18 18 16   Temp: 98 F (36.7 C) 98 F (36.7 C) 98.5 F (36.9 C) 97.9 F (36.6 C)  TempSrc: Oral Oral  Axillary  SpO2: 99% 99% 99% 100%  Weight:      Height:        Intake/Output Summary (Last 24 hours) at 05/05/2020  0909 Last data filed at 05/05/2020 0300 Gross per 24 hour  Intake 480 ml  Output 550 ml  Net -70 ml   Filed Weights   04/27/20 0114  Weight: 54 kg    Examination:  General exam: Appears comfortable   Respiratory system: clear breath sounds b/l. No wheezes or rales  Cardiovascular system: S1 & S2+. No rubs or gallops  Gastrointestinal system: Abd is soft, NT, ND & normal bowel sounds    Central nervous system: Alert and oriented. Moves all 4 extremities  Psychiatry: judgement and insight appear abnormal. Appropriate mood and affect    Data Reviewed: I have personally reviewed following labs and imaging studies  CBC: Recent Labs  Lab 04/28/20 0838 04/29/20 0640 04/30/20 0509 05/01/20 0601 05/02/20 0633 05/03/20 0510 05/04/20 0504 05/05/20 0616  WBC 5.0 6.4 6.6 6.0 6.4 6.4 6.0 6.2  NEUTROABS 3.3 4.6 4.0  --   --   --   --   --   HGB 9.8* 11.6* 9.6* 11.4* 9.6* 9.9* 10.4* 9.7*  HCT 29.0* 33.0* 28.8* 33.1* 28.4* 28.3* 29.9* 29.3*  MCV 92.9 91.4 93.8 92.2 93.4 92.8 92.0 92.7  PLT 181 233 219 296 281 297 361 384   Basic Metabolic Panel: Recent Labs  Lab 05/01/20  3557 05/02/20 3220 05/03/20 0510 05/04/20 0504 05/05/20 0616  NA 136 135 132* 135 134*  K 3.9 3.8 3.7 4.1 4.1  CL 102 103 104 105 103  CO2 27 25 23 23 25   GLUCOSE 123* 111* 93 71 97  BUN 21 18 17 19 23   CREATININE 0.79 0.89 0.87 0.80 0.88  CALCIUM 8.1* 8.1* 7.9* 8.2* 8.2*   GFR: Estimated Creatinine Clearance: 34.8 mL/min (by C-G formula based on SCr of 0.88 mg/dL). Liver Function Tests: No results for input(s): AST, ALT, ALKPHOS, BILITOT, PROT, ALBUMIN in the last 168 hours. No results for input(s): LIPASE, AMYLASE in the last 168 hours. No results for input(s): AMMONIA in the last 168 hours. Coagulation Profile: No results for input(s): INR, PROTIME in the last 168 hours. Cardiac Enzymes: No results for input(s): CKTOTAL, CKMB, CKMBINDEX, TROPONINI in the last 168 hours. BNP (last 3 results) No  results for input(s): PROBNP in the last 8760 hours. HbA1C: No results for input(s): HGBA1C in the last 72 hours. CBG: No results for input(s): GLUCAP in the last 168 hours. Lipid Profile: No results for input(s): CHOL, HDL, LDLCALC, TRIG, CHOLHDL, LDLDIRECT in the last 72 hours. Thyroid Function Tests: No results for input(s): TSH, T4TOTAL, FREET4, T3FREE, THYROIDAB in the last 72 hours. Anemia Panel: No results for input(s): VITAMINB12, FOLATE, FERRITIN, TIBC, IRON, RETICCTPCT in the last 72 hours. Sepsis Labs: No results for input(s): PROCALCITON, LATICACIDVEN in the last 168 hours.  Recent Results (from the past 240 hour(s))  Resp Panel by RT-PCR (Flu A&B, Covid) Nasopharyngeal Swab     Status: None   Collection Time: 04/27/20  2:54 AM   Specimen: Nasopharyngeal Swab; Nasopharyngeal(NP) swabs in vial transport medium  Result Value Ref Range Status   SARS Coronavirus 2 by RT PCR NEGATIVE NEGATIVE Final    Comment: (NOTE) SARS-CoV-2 target nucleic acids are NOT DETECTED.  The SARS-CoV-2 RNA is generally detectable in upper respiratory specimens during the acute phase of infection. The lowest concentration of SARS-CoV-2 viral copies this assay can detect is 138 copies/mL. A negative result does not preclude SARS-Cov-2 infection and should not be used as the sole basis for treatment or other patient management decisions. A negative result may occur with  improper specimen collection/handling, submission of specimen other than nasopharyngeal swab, presence of viral mutation(s) within the areas targeted by this assay, and inadequate number of viral copies(<138 copies/mL). A negative result must be combined with clinical observations, patient history, and epidemiological information. The expected result is Negative.  Fact Sheet for Patients:   Fact Sheet for Healthcare Providers:  06/27/20  This test is  no t yet approved or cleared by the [employer] FDA and  has been authorized for detection and/or diagnosis of SARS-CoV-2 by FDA under an Emergency Use Authorization (EUA). This EUA will remain  in effect (meaning this test can be used) for the duration of the COVID-19 declaration under Section 564(b)(1) of the Act, 21 U.S.C.section 360bbb-3(b)(1), unless the authorization is terminated  or revoked sooner.       Influenza A by PCR NEGATIVE NEGATIVE Final   Influenza B by PCR NEGATIVE NEGATIVE Final    Comment: (NOTE) The Xpert Xpress SARS-CoV-2/FLU/RSV plus assay is intended as an aid in the diagnosis of influenza from Nasopharyngeal swab specimens and should not be used as a sole basis for treatment. Nasal washings and aspirates are unacceptable for Xpert Xpress SARS-CoV-2/FLU/RSV testing.  Fact Sheet for Patients: SeriousBroker.it  Fact Sheet for Healthcare Providers:  SeriousBroker.it  This test is not yet approved or cleared by the [place] and has been authorized for detection and/or diagnosis of SARS-CoV-2 by FDA under an Emergency Use Authorization (EUA). This EUA will remain in effect (meaning this test can be used) for the duration of the COVID-19 declaration under Section 564(b)(1) of the Act, 21 U.S.C. section 360bbb-3(b)(1), unless the authorization is terminated or revoked.  Performed at [facility], [address] [place], [place] [address]   SARS CORONAVIRUS 2 (TAT 6-24 HRS) Nasopharyngeal Nasopharyngeal Swab     Status: None   Collection Time: 05/04/20  4:07 PM   Specimen: Nasopharyngeal Swab  Result Value Ref Range Status   SARS Coronavirus 2 NEGATIVE NEGATIVE Final    Comment: (NOTE) SARS-CoV-2 target nucleic acids are NOT DETECTED.  The SARS-CoV-2 RNA is generally detectable in upper and lower respiratory specimens during the acute phase of infection. Negative results do not  preclude SARS-CoV-2 infection, do not rule out co-infections with other pathogens, and should not be used as the sole basis for treatment or other patient management decisions. Negative results must be combined with clinical observations, patient history, and epidemiological information. The expected result is Negative.  Fact Sheet for Patients: HairSlick.no  Fact Sheet for Healthcare Providers: quierodirigir.com  This test is not yet approved or cleared by the [place] FDA and  has been authorized for detection and/or diagnosis of SARS-CoV-2 by FDA under an Emergency Use Authorization (EUA). This EUA will remain  in effect (meaning this test can be used) for the duration of the COVID-19 declaration under Se ction 564(b)(1) of the Act, 21 U.S.C. section 360bbb-3(b)(1), unless the authorization is terminated or revoked sooner.  Performed at [facility], [address], [place], [place] [address]          Radiology Studies: No results found.      Scheduled Meds: . amLODipine  5 mg Oral Daily  . aspirin EC  81 mg Oral BID  . atorvastatin  20 mg Oral Daily  . Chlorhexidine Gluconate Cloth  6 each Topical Daily  . docusate sodium  100 mg Oral BID  . dorzolamide-timolol  1 drop Both Eyes BID  . levothyroxine  75 mcg Oral QAC breakfast  . tranexamic acid (CYKLOKAPRON) topical - INTRAOP  2,000 mg Topical Once   Continuous Infusions:   LOS: 8 days    Time spent: 20 mins    Charise Killian, MD [facility] Pager [phone]  If 7PM-7AM, please contact night-coverage 05/05/2020, 9:09 AM

## 2020-05-05 NOTE — Progress Notes (Signed)
Nutrition Follow-up  DOCUMENTATION CODES:   Not applicable  INTERVENTION:   -Magic cup BID with meals, each supplement provides 290 kcal and 9 grams of protein -MVI with minerals daily  NUTRITION DIAGNOSIS:   Increased nutrient needs related to post-op healing,hip fracture as evidenced by estimated needs.  Ongoing  GOAL:   Patient will meet greater than or equal to 90% of their needs  Progressing   MONITOR:   PO intake,Supplement acceptance,Weight trends,Labs,I & O's,Skin  REASON FOR ASSESSMENT:   Consult Assessment of nutrition requirement/status (hip fx)  ASSESSMENT:   87 year female admitted with closed displaced fracture of right femoral neck following unwitnessed fall. Past medical history of hypothyroidism, HTN, DM2, severe hearing impairment, and osteoporosis.  3/6- s/p rt anterior hip hemiarthroplasty  Reviewed I/O's: -70 ml x 24 hours and -4.3 L since admission  UOP: 550 ml x 24 hours  Pt unable to provide history. She continually states "I'm cold and its cold outside".   Pt with good appetite. Noted meal completion 50-90%. Pt with no teeth.   Reviewed wt hx; wt has been stable over the past year.   Pt with generalized fat and muscle depletions, which is likely related to advanced age.   Medications reviewed and include colace.   Pt awaiting insurance authorization for SNF placement at discharge.   Labs reviewed: Na: 134.   NUTRITION - FOCUSED PHYSICAL EXAM:  Flowsheet Row Most Recent Value  Orbital Region Moderate depletion  Upper Arm Region Mild depletion  Thoracic and Lumbar Region No depletion  Buccal Region Mild depletion  Temple Region Mild depletion  Clavicle Bone Region Mild depletion  Clavicle and Acromion Bone Region Mild depletion  Scapular Bone Region Mild depletion  Dorsal Hand Mild depletion  Patellar Region Mild depletion  Anterior Thigh Region Mild depletion  Posterior Calf Region Mild depletion  Edema (RD Assessment)  Mild  Hair Reviewed  Eyes Reviewed  Mouth Reviewed  Skin Reviewed  Nails Reviewed       Diet Order:   Diet Order            Diet regular Room service appropriate? Yes; Fluid consistency: Thin  Diet effective now                 EDUCATION NEEDS:   No education needs have been identified at this time  Skin:  Skin Assessment: Skin Integrity Issues: Skin Integrity Issues:: Incisions Incisions: closed; right hip  Last BM:  05/01/20  Height:   Ht Readings from Last 1 Encounters:  04/27/20 5\' 5"  (1.651 m)    Weight:   Wt Readings from Last 1 Encounters:  04/27/20 54 kg   BMI:  Body mass index is 19.81 kg/m.  Estimated Nutritional Needs:   Kcal:  1450-1650  Protein:  65-80 grams  Fluid:  > 1.4 L    06/27/20, RD, LDN, CDCES Registered Dietitian II Certified Diabetes Care and Education Specialist Please refer to [facility] for RD and/or RD on-call/weekend/after hours pager

## 2020-05-05 NOTE — TOC Progression Note (Signed)
Transition of Care [facility]) - Progression Note    Patient Details  Name: Ashley Cantrell MRN: [id number] Date of Birth: [birth year]  [facility]) CM/SW Contact  Caryn Section, RN Phone Number: 05/05/2020, 10:34 AM  Clinical Narrative:   Jory Sims spoke with Verlon Au from [employer].  [employer] authorization not received yet, will check back at the end of the day for authorization.      Expected Discharge Plan: Skilled Nursing Facility Barriers to Discharge: Continued Medical Work up  Expected Discharge Plan and Services Expected Discharge Plan: Skilled Nursing Facility     Post Acute Care Choice: Skilled Nursing Facility Living arrangements for the past 2 months: Single Family Home                                       Social Determinants of Health (SDOH) Interventions    Readmission Risk Interventions No flowsheet data found.

## 2020-05-05 NOTE — Progress Notes (Signed)
Physical Therapy Treatment Patient Details Name: Ashley Cantrell MRN: [id number] DOB: 1926/01/15 Today's Date: 05/05/2020    History of Present Illness Pt is a 87 y.o. female with medical history significant for hypothyroidism, HTN, type 2 diabetes, hearing impairment, osteoporosis with history of left hip fracture who presents by EMS following an unwitnessed fall sustaining injury to the right hip and right chin.  Pt diagnosed with displaced right subcapital hip fracture and is s/p R hip anterior hemiarthroplasty.    PT Comments    Pt in bed, inc loose BM.  OOB with supervision for safety.  She is able to stand with min guard and walk to bathroom then around bed to recliner with min guard/assist.  Pt is inc loose BM on the way to toilet and is able to have a large formed BM once there.  Care provided for bathing and new gown/linen changes.  She is fatigued with activity but remains in chair with needs met.  Tech in to assist as needed.   Follow Up Recommendations  SNF     Equipment Recommendations       Recommendations for Other Services       Precautions / Restrictions Precautions Precautions: Fall;Anterior Hip Restrictions Weight Bearing Restrictions: Yes RLE Weight Bearing: Weight bearing as tolerated    Mobility  Bed Mobility Overal bed mobility: Modified Independent       Supine to sit: Modified independent (Device/Increase time);HOB elevated          Transfers Overall transfer level: Needs assistance Equipment used: Rolling walker (2 wheeled) Transfers: Sit to/from Stand Sit to Stand: Min guard         General transfer comment: Pt requiring assistance for safety only.  Ambulation/Gait Ambulation/Gait assistance: Min assist;Min guard Gait Distance (Feet): 30 Feet Assistive device: Rolling walker (2 wheeled) Gait Pattern/deviations: Step-to pattern;Decreased stance time - right;Decreased step length - left;Antalgic Gait velocity: decreased   General  Gait Details: to bathroom then back around bed to recliner   Stairs             Wheelchair Mobility    Modified Rankin (Stroke Patients Only)       Balance Overall balance assessment: Needs assistance Sitting-balance support: Bilateral upper extremity supported;Feet supported Sitting balance-Leahy Scale: Good     Standing balance support: Bilateral upper extremity supported;During functional activity Standing balance-Leahy Scale: Fair Standing balance comment: heavy reliance on RW, UE support                            Cognition Arousal/Alertness: Awake/alert Behavior During Therapy: WFL for tasks assessed/performed Overall Cognitive Status: History of cognitive impairments - at baseline                                        Exercises Other Exercises Other Exercises: to commode to void and BM.    General Comments        Pertinent Vitals/Pain Pain Assessment: Faces Faces Pain Scale: Hurts a little bit Pain Location: R leg with gait Pain Descriptors / Indicators: Sore Pain Intervention(s): Limited activity within patient's tolerance;Monitored during session;Repositioned    Home Living                      Prior Function            PT Goals (current goals can  now be found in the care plan section) Progress towards PT goals: Progressing toward goals    Frequency    7X/week      PT Plan Current plan remains appropriate    Co-evaluation              AM-PAC PT "6 Clicks" Mobility   Outcome Measure  Help needed turning from your back to your side while in a flat bed without using bedrails?: None Help needed moving from lying on your back to sitting on the side of a flat bed without using bedrails?: None Help needed moving to and from a bed to a chair (including a wheelchair)?: A Little Help needed standing up from a chair using your arms (e.g., wheelchair or bedside chair)?: A Little Help needed to walk  in hospital room?: A Little Help needed climbing 3-5 steps with a railing? : A Lot 6 Click Score: 19    End of Session Equipment Utilized During Treatment: Gait belt Activity Tolerance: Patient tolerated treatment well Patient left: in chair;with call bell/phone within reach;with chair alarm set Nurse Communication: Mobility status;Other (comment) PT Visit Diagnosis: Unsteadiness on feet (R26.81);History of falling (Z91.81);Other abnormalities of gait and mobility (R26.89);Muscle weakness (generalized) (M62.81);Pain Pain - Right/Left: Right Pain - part of body: Hip     Time: 0677-0340 PT Time Calculation (min) (ACUTE ONLY): 15 min  Charges:  $Gait Training: 8-22 mins                    Chesley Noon, PTA 05/05/20, 10:54 AM

## 2020-05-05 NOTE — Progress Notes (Signed)
Occupational Therapy Treatment Patient Details Name: Ashley Cantrell MRN: [id number] DOB: 03-14-1925 Today's Date: 05/05/2020    History of present illness Pt is a 87 y.o. female with medical history significant for hypothyroidism, HTN, type 2 diabetes, hearing impairment, osteoporosis with history of left hip fracture who presents by EMS following an unwitnessed fall sustaining injury to the right hip and right chin.  Pt diagnosed with displaced right subcapital hip fracture and is s/p R hip anterior hemiarthroplasty.   OT comments  Pt seen for OT treatment on this date. Upon arrival to room, pt awake and seated upright in bed following walk to bathroom/toilet with RN. Pt alert, however confused and disoriented to place and situation. Attempted to engage in OOB mobility (via oral and written communication and with family at bedside), however pt stating "I am okay, I am not sick, I am not in pain" and requesting to walk around with this Chartered loss adjuster tomorrow. Pt engaged in bed-level grooming with SET-UP assist. Pt is making good progress toward goals and continues to benefit from skilled OT services to maximize return to PLOF and minimize risk of future falls, injury, caregiver burden, and readmission. Will continue to follow POC. Discharge recommendation remains appropriate.    Follow Up Recommendations  SNF    Equipment Recommendations  Other (comment) (defer to next venue of care)       Precautions / Restrictions Precautions Precautions: Fall;Anterior Hip Restrictions Weight Bearing Restrictions: Yes RLE Weight Bearing: Weight bearing as tolerated       Mobility Bed Mobility     General bed mobility comments: Attempted to engage in OOB mobility (via oral and written communication and with family at bedside), however pt stating "I am okay, I am not sick, I am not in pain" and requesting to see this Chartered loss adjuster tomorrow           ADL either performed or assessed with clinical judgement    ADL Overall ADL's : Needs assistance/impaired     Grooming: Wash/dry hands;Wash/dry face;Modified independent;Bed level                                                 Cognition Arousal/Alertness: Awake/alert Behavior During Therapy: WFL for tasks assessed/performed Overall Cognitive Status: History of cognitive impairments - at baseline                                 General Comments: Pt alert, however confused and disoriented to place and situation. Attempted to engage in OOB mobility (via oral and written communication and with family at bedside), however pt stating "I am okay, I am not sick, I am not in pain"                   Pertinent Vitals/ Pain       Pain Assessment: No/denies pain Faces Pain Scale: Hurts a little bit Pain Location: R leg with gait Pain Descriptors / Indicators: Sore Pain Intervention(s): Limited activity within patient's tolerance;Monitored during session;Repositioned         Frequency  Min 2X/week        Progress Toward Goals  OT Goals(current goals can now be found in the care plan section)  Progress towards OT goals: Progressing toward goals  Acute Rehab OT Goals Patient Stated Goal:  to go home OT Goal Formulation: With patient Time For Goal Achievement: 05/14/20 Potential to Achieve Goals: Poor  Plan Discharge plan remains appropriate;Frequency remains appropriate       AM-PAC OT "6 Clicks" Daily Activity     Outcome Measure   Help from another person eating meals?: None Help from another person taking care of personal grooming?: A Little Help from another person toileting, which includes using toliet, bedpan, or urinal?: A Lot Help from another person bathing (including washing, rinsing, drying)?: A Lot Help from another person to put on and taking off regular upper body clothing?: A Little Help from another person to put on and taking off regular lower body clothing?: A Little 6 Click  Score: 17    End of Session    OT Visit Diagnosis: Unsteadiness on feet (R26.81);Muscle weakness (generalized) (M62.81)   Activity Tolerance Patient tolerated treatment well   Patient Left in bed;with call bell/phone within reach;with bed alarm set;with family/visitor present   Nurse Communication Mobility status        Time: 7893-8101 OT Time Calculation (min): 11 min  Charges: OT General Charges $OT Visit: 1 Visit OT Treatments $Self Care/Home Management : 8-22 mins   Matthew Folks, OTR/L [facility] [phone]

## 2020-05-05 NOTE — TOC Progression Note (Signed)
Transition of Care [facility]) - Progression Note    Patient Details  Name: TEODORA BAUMGARTEN MRN: [id number] Date of Birth: Jan 28, 1926  Transition of Care [facility]) CM/SW Contact  Caryn Section, RN Phone Number: 05/05/2020, 3:44 PM  Clinical Narrative:   TOC in to see patient and daughter at bedside.  Relayed information that insurance authorization was just obtained, and patient will go to [employer] in the early AM.  Daughter amenable to plan.  No questions or concerns at this time.    Expected Discharge Plan: Skilled Nursing Facility Barriers to Discharge: Continued Medical Work up  Expected Discharge Plan and Services Expected Discharge Plan: Skilled Nursing Facility     Post Acute Care Choice: Skilled Nursing Facility Living arrangements for the past 2 months: Single Family Home                                       Social Determinants of Health (SDOH) Interventions    Readmission Risk Interventions No flowsheet data found.

## 2020-05-05 NOTE — Progress Notes (Signed)
  Subjective:  POD #8 s/p right hip hemiarthroplasty.   Patient up out of bed to a chair eating lunch.  Patient alert and cooperative.  Patient reports no significant right hip pain  Objective:   VITALS:   Vitals:   05/04/20 2326 05/05/20 0417 05/05/20 0907 05/05/20 1232  BP: 133/60 123/66 (!) 149/55 (!) 103/58  Pulse: 71 71 82 73  Resp: 18 18 16 18   Temp: 98 F (36.7 C) 98.5 F (36.9 C) 97.9 F (36.6 C) 98.5 F (36.9 C)  TempSrc: Oral  Axillary   SpO2: 99% 99% 100% 100%  Weight:      Height:        PHYSICAL EXAM: Right lower extremity Neurovascular intact Sensation intact distally Intact pulses distally Dorsiflexion/Plantar flexion intact Aquacel dressing: scant drainage No cellulitis present Compartment soft  LABS  Results for orders placed or performed during the hospital encounter of 04/27/20 (from the past 24 hour(s))  SARS CORONAVIRUS 2 (TAT 6-24 HRS) Nasopharyngeal Nasopharyngeal Swab     Status: None   Collection Time: 05/04/20  4:07 PM   Specimen: Nasopharyngeal Swab  Result Value Ref Range   SARS Coronavirus 2 NEGATIVE NEGATIVE  CBC     Status: Abnormal   Collection Time: 05/05/20  6:16 AM  Result Value Ref Range   WBC 6.2 4.0 - 10.5 K/uL   RBC 3.16 (L) 3.87 - 5.11 MIL/uL   Hemoglobin 9.7 (L) 12.0 - 15.0 g/dL   HCT 05/07/20 (L) 98.9 - 21.1 %   MCV 92.7 80.0 - 100.0 fL   MCH 30.7 26.0 - 34.0 pg   MCHC 33.1 30.0 - 36.0 g/dL   RDW 94.1 74.0 - 81.4 %   Platelets 384 150 - 400 K/uL   nRBC 0.0 0.0 - 0.2 %  Basic metabolic panel     Status: Abnormal   Collection Time: 05/05/20  6:16 AM  Result Value Ref Range   Sodium 134 (L) 135 - 145 mmol/L   Potassium 4.1 3.5 - 5.1 mmol/L   Chloride 103 98 - 111 mmol/L   CO2 25 22 - 32 mmol/L   Glucose, Bld 97 70 - 99 mg/dL   BUN 23 8 - 23 mg/dL   Creatinine, Ser 05/07/20 0.44 - 1.00 mg/dL   Calcium 8.2 (L) 8.9 - 10.3 mg/dL   GFR, Estimated 8.56 >31 mL/min   Anion gap 6 5 - 15    No results  found.  Assessment/Plan: 8 Days Post-Op   Principal Problem:   Closed displaced fracture of right femoral neck (HCC) Active Problems:   Essential hypertension   Hypothyroidism   Type 2 diabetes, diet controlled (HCC)   Hearing impairment   History of fracture of left hip   Osteoporosis   Unwitnessed fall   Preoperative clearance   Closed right hip fracture [facility])  Patient is stable from an orthopedic standpoint.  Hemoglobin and hematocrit are stable. Up with therapy WBAT RLE Aspirin 81 mg twice daily for 30 days D/C per hospitialist     [facility] , MD 05/05/2020, 12:58 PM

## 2020-05-06 DIAGNOSIS — F0390 Unspecified dementia without behavioral disturbance: Secondary | ICD-10-CM

## 2020-05-06 LAB — BASIC METABOLIC PANEL
Anion gap: 6 (ref 5–15)
BUN: 21 mg/dL (ref 8–23)
CO2: 24 mmol/L (ref 22–32)
Calcium: 8.3 mg/dL — ABNORMAL LOW (ref 8.9–10.3)
Chloride: 103 mmol/L (ref 98–111)
Creatinine, Ser: 0.89 mg/dL (ref 0.44–1.00)
GFR, Estimated: >60 mL/min (ref >60)
Glucose, Bld: 155 mg/dL — ABNORMAL HIGH (ref 70–99)
Potassium: 3.6 mmol/L (ref 3.5–5.1)
Sodium: 133 mmol/L — ABNORMAL LOW (ref 135–145)

## 2020-05-06 LAB — CBC
HCT: 29.1 % — ABNORMAL LOW (ref 36.0–46.0)
Hemoglobin: 9.9 g/dL — ABNORMAL LOW (ref 12.0–15.0)
MCH: 31.7 pg (ref 26.0–34.0)
MCHC: 34 g/dL (ref 30.0–36.0)
MCV: 93.3 fL (ref 80.0–100.0)
Platelets: 394 10*3/uL (ref 150–400)
RBC: 3.12 MIL/uL — ABNORMAL LOW (ref 3.87–5.11)
RDW: 15.2 % (ref 11.5–15.5)
WBC: 7.3 10*3/uL (ref 4.0–10.5)
nRBC: 0 % (ref 0.0–0.2)

## 2020-05-06 MED ORDER — ASPIRIN 81 MG PO TBEC: 81.0000 mg | DELAYED_RELEASE_TABLET | Freq: Two times a day (BID) | ORAL | 0 refills | Status: AC

## 2020-05-06 NOTE — Progress Notes (Signed)
Patient tolerated transfer to stretcher well. NAD. Daughter and family friend at bedside. Report given to [facility]. Family Friend took the patient's belongings bag.

## 2020-05-06 NOTE — Care Management Important Message (Signed)
Important Message  Patient Details  Name: Ashley Cantrell MRN: [id number] Date of Birth: 1925-06-30   Medicare Important Message Given:  Yes     [name] 05/06/2020, 11:43 AM

## 2020-05-06 NOTE — Progress Notes (Signed)
[facility] is at bedside.

## 2020-05-06 NOTE — Progress Notes (Signed)
Brief was placed on the patient. Patient was changed into hospital clothes. Patient assisted with the change. Waiting for transport. Family at bedside.

## 2020-05-06 NOTE — Progress Notes (Signed)
Physical Therapy Treatment Patient Details Name: Ashley Cantrell MRN: [id number] DOB: Dec 31, 1925 Today's Date: 05/06/2020    History of Present Illness Pt is a 87 y.o. female with medical history significant for hypothyroidism, HTN, type 2 diabetes, hearing impairment, osteoporosis with history of left hip fracture who presents by EMS following an unwitnessed fall sustaining injury to the right hip and right chin.  Pt diagnosed with displaced right subcapital hip fracture and is s/p R hip anterior hemiarthroplasty.    PT Comments    OOB with min assist today.  She is taken to bathroom with no results, then walks out of room to opposite wall then to recliner with RW and min guard/assist +1 for navigation and slight balance disturbances.  She remains in chair with needs met.  Friend, daughter and interpreter in room (for daughter - ASL).  Questions answered as appropriate from daughter.   Follow Up Recommendations  SNF     Equipment Recommendations  None recommended by PT    Recommendations for Other Services       Precautions / Restrictions Precautions Precautions: Fall;Anterior Hip Restrictions Weight Bearing Restrictions: Yes RLE Weight Bearing: Weight bearing as tolerated    Mobility  Bed Mobility Overal bed mobility: Needs Assistance Bed Mobility: Supine to Sit     Supine to sit: Min assist     General bed mobility comments: slight assist today as she reaches for my hand to help    Transfers Overall transfer level: Needs assistance Equipment used: Rolling walker (2 wheeled) Transfers: Sit to/from Stand Sit to Stand: Min guard            Ambulation/Gait Ambulation/Gait assistance: Min assist;Min guard Gait Distance (Feet): 40 Feet Assistive device: Rolling walker (2 wheeled) Gait Pattern/deviations: Step-to pattern;Decreased stance time - right;Decreased step length - left;Antalgic Gait velocity: decreased   General Gait Details: able to progress gait out  of her room to opposite wall and back to recliner   Stairs             Wheelchair Mobility    Modified Rankin (Stroke Patients Only)       Balance Overall balance assessment: Needs assistance Sitting-balance support: Bilateral upper extremity supported;Feet supported Sitting balance-Leahy Scale: Good     Standing balance support: Bilateral upper extremity supported;During functional activity Standing balance-Leahy Scale: Fair Standing balance comment: heavy reliance on RW, UE support                            Cognition Arousal/Alertness: Awake/alert Behavior During Therapy: WFL for tasks assessed/performed Overall Cognitive Status: History of cognitive impairments - at baseline                                        Exercises Other Exercises Other Exercises: to commode with no results    General Comments        Pertinent Vitals/Pain Pain Assessment: No/denies pain    Home Living                      Prior Function            PT Goals (current goals can now be found in the care plan section) Progress towards PT goals: Progressing toward goals    Frequency    7X/week      PT Plan Current plan remains  appropriate    Co-evaluation              AM-PAC PT "6 Clicks" Mobility   Outcome Measure  Help needed turning from your back to your side while in a flat bed without using bedrails?: None Help needed moving from lying on your back to sitting on the side of a flat bed without using bedrails?: None Help needed moving to and from a bed to a chair (including a wheelchair)?: A Little Help needed standing up from a chair using your arms (e.g., wheelchair or bedside chair)?: A Little Help needed to walk in hospital room?: A Little Help needed climbing 3-5 steps with a railing? : A Lot 6 Click Score: 19    End of Session Equipment Utilized During Treatment: Gait belt Activity Tolerance: Patient tolerated  treatment well;Patient limited by fatigue Patient left: in chair;with call bell/phone within reach;with chair alarm set;with family/visitor present Nurse Communication: Mobility status;Other (comment) PT Visit Diagnosis: Unsteadiness on feet (R26.81);History of falling (Z91.81);Other abnormalities of gait and mobility (R26.89);Muscle weakness (generalized) (M62.81);Pain Pain - Right/Left: Right Pain - part of body: Hip     Time: 0979-4997 PT Time Calculation (min) (ACUTE ONLY): 17 min  Charges:  $Gait Training: 8-22 mins                    Chesley Noon, PTA 05/06/20, 10:56 AM

## 2020-05-06 NOTE — TOC Transition Note (Signed)
Transition of Care [facility]) - CM/SW Discharge Note   Patient Details  Name: Ashley Cantrell MRN: [id number] Date of Birth: 01/29/1926  Transition of Care  [facility]) CM/SW Contact:  Caryn Section, RN Phone Number: 05/06/2020, 11:15 AM   Clinical Narrative:   TOC at bedside with patient and daughter.  Discussed discharge planning, patient will go to [facility] today room 401 by [facility] approximately 1pm.  Patient and family amenable.  Information given to nurse for the purposes of calling report.  No questions or concerns from patient or family.  TOC signing off    Final next level of care: Skilled Nursing Facility Barriers to Discharge: Barriers Resolved   Patient Goals and CMS Choice   CMS Medicare.gov Compare Post Acute Care list provided to:: Patient Choice offered to / list presented to : Patient,Adult Children  Discharge Placement PASRR number recieved: 05/02/20            Patient chooses bed at: [facility] Patient to be transferred to facility by: [facility] Name of family member notified: Angelique Blonder Daughter Patient and family notified of of transfer: 05/06/20  Discharge Plan and Services     Post Acute Care Choice: Skilled Nursing Facility                  Representative spoke with at DME Agency: No DME, patient going to facility         Representative spoke with at [facility] Agency: n/a  Social Determinants of Health (SDOH) Interventions     Readmission Risk Interventions No flowsheet data found.

## 2020-05-06 NOTE — Progress Notes (Signed)
Patient given a lunch tray. Family at bedside assisting the patient.

## 2020-05-06 NOTE — Progress Notes (Signed)
Pt moved from 150-A to 3B. Reported to Marisue Ivan, [place]

## 2020-05-06 NOTE — Progress Notes (Signed)
  Subjective:  Patient reports pain as mild.  POD #9 s/p right hip hemiarthroplasty.  Patient alert and cooperative.  Patient reports no significant right hip pain   Objective:   VITALS:   Vitals:   05/05/20 1604 05/05/20 2115 05/06/20 0458 05/06/20 0728  BP: (!) 111/52 (!) 139/56 (!) 132/48 (!) 152/55  Pulse: 74 75 65 72  Resp: 16 17 16 16   Temp: 98.3 F (36.8 C) 97.9 F (36.6 C) 97.7 F (36.5 C) 97.6 F (36.4 C)  TempSrc:  Oral  Oral  SpO2: 100% 99% 99% 100%  Weight:      Height:        PHYSICAL EXAM:  Neurologically intact ABD soft Neurovascular intact Sensation intact distally Intact pulses distally Dorsiflexion/Plantar flexion intact Incision: dressing C/D/I No cellulitis present Compartment soft  LABS  Results for orders placed or performed during the hospital encounter of 04/27/20 (from the past 24 hour(s))  CBC     Status: Abnormal   Collection Time: 05/06/20  6:12 AM  Result Value Ref Range   WBC 7.3 4.0 - 10.5 K/uL   RBC 3.12 (L) 3.87 - 5.11 MIL/uL   Hemoglobin 9.9 (L) 12.0 - 15.0 g/dL   HCT 05/08/20 (L) 29.9 - 24.2 %   MCV 93.3 80.0 - 100.0 fL   MCH 31.7 26.0 - 34.0 pg   MCHC 34.0 30.0 - 36.0 g/dL   RDW 68.3 41.9 - 62.2 %   Platelets 394 150 - 400 K/uL   nRBC 0.0 0.0 - 0.2 %  Basic metabolic panel     Status: Abnormal   Collection Time: 05/06/20  6:12 AM  Result Value Ref Range   Sodium 133 (L) 135 - 145 mmol/L   Potassium 3.6 3.5 - 5.1 mmol/L   Chloride 103 98 - 111 mmol/L   CO2 24 22 - 32 mmol/L   Glucose, Bld 155 (H) 70 - 99 mg/dL   BUN 21 8 - 23 mg/dL   Creatinine, Ser 05/08/20 0.44 - 1.00 mg/dL   Calcium 8.3 (L) 8.9 - 10.3 mg/dL   GFR, Estimated 9.89 >21 mL/min   Anion gap 6 5 - 15    No results found.  Assessment/Plan: 9 Days Post-Op   Principal Problem:   Closed displaced fracture of right femoral neck (HCC) Active Problems:   Essential hypertension   Hypothyroidism   Type 2 diabetes, diet controlled (HCC)   Hearing impairment    History of fracture of left hip   Osteoporosis   Unwitnessed fall   Preoperative clearance   Closed right hip fracture (HCC)   Advance diet Patient is stable from an orthopedic standpoint.  Hemoglobin and hematocrit are stable. Up with therapy WBAT RLE Aspirin 81 mg twice daily for 30 days D/C per hospitialist Follow up next week in office for staple removal Call to confirm appointment [phone]  [phone] , PA-C 05/06/2020, 8:07 AM

## 2020-05-06 NOTE — Discharge Instructions (Signed)
Hip Fracture  A hip fracture is a break in the upper part of the thigh bone (femur). This is usually the result of an injury, commonly a fall. What are the causes? This condition may be caused by:  A direct hit or injury (trauma) to the side of the hip, such as from a fall or a car accident. What increases the risk? You are more likely to develop this condition if:  You have poor balance or an unsteady walking pattern (gait). Certain conditions contribute to poor balance, including Parkinson disease and dementia.  You have thinning or weakening of your bones, such as from osteopenia or osteoporosis.  You have cancer that spreads to the leg bones.  You have certain conditions that can weaken your bones, such as thyroid disorders, intestine disorders, or a lack (deficiency) of certain nutrients.  You smoke.  You take certain medicines, such as steroids.  You have a history of broken bones. What are the signs or symptoms? Symptoms of this condition include:  Pain over the injured hip. This is commonly felt on the side of the hip or in the front groin area.  Stiffness, bruising, and swelling over the hip.  Pain with movement of the leg, especially lifting it up. Pain often gets better with rest.  Difficulty or inability to stand, walk, or use the leg to support body weight (put weight on the leg).  The leg rolling outward when lying down.  The affected leg being shorter than the other leg. How is this diagnosed? This condition may be diagnosed based on:  Your symptoms.  A physical exam.  X-rays. These may be done: ? To confirm the diagnosis. ? To determine the type and location of the fracture. ? To check for other injuries.  MRI or CT scans. These may be done if the fracture is not visible on an X-ray. How is this treated? Treatment for this condition depends on the severity and location of your fracture. In most cases, surgery is necessary. Surgery may  involve:  Repairing the fracture with a screw, nail, or rod to hold the bone in place (open reduction and internal fixation, ORIF).  Replacing the damaged parts of the femur with metal implants (hemiarthroplasty or arthroplasty). If your fracture is less severe, or if you are not eligible for surgery, you may have non-surgical treatment. Non-surgical treatment may involve:  Using crutches, a walker, or a wheelchair until your health care provider says that you can support (bear) weight on your hip.  Medicines to help reduce pain and swelling.  Having regular X-rays to monitor your fracture and make sure that it is healing.  Physical therapy. You may need physical therapy after surgery, too. Follow these instructions at home: Activity  Do not use your injured leg to support your body weight until your health care provider says that you can. ? Follow standing and walking restrictions as told by your health care provider. ? Use crutches, a walker, or a wheelchair as directed.  Avoid any activities that cause pain or irritation in your hip. Ask your health care provider what activities are safe for you.  Do not drive or use heavy machinery until your health care provider approves.  If physical therapy was prescribed, do exercises as told by your health care provider. General instructions  Take over-the-counter and prescription medicines only as told by your health care provider.  If directed, put ice on the injured area: ? Put ice in a plastic bag. ?   Place a towel between your skin and the bag. ? Leave the ice on for 20 minutes, 2-3 times a day.  Do not use any products that contain nicotine or tobacco, such as cigarettes and e-cigarettes. These can delay bone healing. If you need help quitting, ask your health care provider.  Keep all follow-up visits as told by your health care provider. This is important.   How is this prevented?  To prevent falls at home: ? Use a cane,  walker, or wheelchair as directed. ? Make sure your rooms and hallways are free of clutter, obstacles, and cords. ? Install grab bars in your bedroom and bathrooms. ? Always use handrails when going up and down stairs. ? Use nightlights around the house.  Exercise regularly. Ask what forms of exercise are safe for you, such as walking and strength and balance exercises.  Visit an eye doctor regularly to have your eyesight checked. This can help prevent falls.  Make sure you get enough calcium and vitamin D.  Do not use any products that contain nicotine or tobacco, such as cigarettes and e-cigarettes. If you need help quitting, ask your health care provider.  Limit alcohol use.  If you have an underlying condition that caused your hip fracture, work with your health care provider to manage your condition. Contact a health care provider if:  Your pain gets worse or it does not get better with rest or medicine.  You develop any of the following in your leg or foot: ? Numbness. ? Tingling. ? A change in skin color (discoloration). ? Skin feeling cold to the touch. Get help right away if:  Your pain suddenly gets worse.  You cannot move your hip. Summary  A hip fracture is a break in the upper part of the thigh bone (femur).  Treatment typically requires surgical management to restore stability and function to the hip.  Pain medicine and icing of the affected leg can help manage pain and swelling. Follow directions as told by your health care provider. This information is not intended to replace advice given to you by your health care provider. Make sure you discuss any questions you have with your health care provider. Document Revised: 10/12/2019 Document Reviewed: 10/12/2019 Elsevier Patient Education  2021 Elsevier Inc. What to do after you leave the hospital:  Recommended diet: {diet:18262}  Recommended activity: {discharge activity:18261}  Follow-up with *** {follow  up:32580}.  Follow up with *** if you experience any of the following symptoms: ***  Other instructions:  ***

## 2020-05-06 NOTE — Discharge Summary (Signed)
Physician Discharge Summary  PETRICE BEEDY ZOX:[id number] DOB: [birth year] DOA: 04/27/2020  PCP: [facility]  Admit date: 04/27/2020 Discharge date: 05/06/2020  Admitted From: home Disposition:  SNF  Recommendations for Outpatient Follow-up:  1. Follow up with PCP in 1-2 weeks 2. F/u w/ ortho surg, Dr. Odis Luster, in 1 week for staple removal  Home Health: no  Equipment/Devices:  Discharge Condition: Stable  CODE STATUS: DNR  Diet recommendation: Heart Healthy  Brief/Interim Summary: HPI was taken from Dr. Para March: SHAUN ZUCCARO is a 87 y.o. female with medical history significant for Hypothyroidism, HTN, type 2 diabetes, hearing impairment, osteoporosis with history of left hip fracture who presents by EMS following an  unwitnessed fall sustaining injury to the right hip and right chin.  Patient was previously in her usual state of health.  Most of history provided by her niece at the bedside through a video relay service as niece at bedside is deaf. ED course: On arrival, temperature at 99.2, BP 176/93, pulse 86 O2 sat 100% on room air.  CBC and BMP unremarkable.  Covid and flu pending EKG as interpreted by me: Sinus rhythm at 90 with nonspecific ST-T wave changes Chest x-ray: Mild pulmonary vascular congestion. Could reflect early interstitial edema Right hip x-ray: Transcervical right femoral neck fracture with surrounding edema  Hospital Course from Dr. Mayford Knife 3/9-3/15/22: Pt was found to have closed displaced fracture of the right femoral neck. Pt is s/p right anterior hip hemiarthroplasty on 04/27/20. Pt will continue on aspirin BID x 30 days for DVT prophylaxis as per ortho surg. Of note, pt has severe hearing impairment & dementia. For more information, please see previous progress/consult notes.    Discharge Diagnoses:  Principal Problem:   Closed displaced fracture of right femoral neck (HCC) Active Problems:   Essential hypertension   Hypothyroidism   Type 2  diabetes, diet controlled (HCC)   Hearing impairment   History of fracture of left hip   Osteoporosis   Unwitnessed fall   Preoperative clearance   Closed right hip fracture (HCC)  Closed displaced fracture right femoral neck: s/p right anterior hip hemiarthroplasty on 04/27/20. Continue on aspirin for 30 days for DVT prophylaxis as per ortho surg. PT recs SNF   Acute blood loss anemia: H&H are trending up today. No need for a transfusion currently   Hx of dementia: continue w/ supportive care  HLD: continue on statin   Hypothyroidism: continue on levothyroxine   Hearing impairment: severe. Continue w/ supportive care  Discharge Instructions  Discharge Instructions    Diet - low sodium heart healthy   Complete by: As directed    Discharge instructions   Complete by: As directed    F/u w/ ortho surg, Dr. Odis Luster, in 1 week for staple removal. F/u w/ PCP in 1-2 weeks   Increase activity slowly   Complete by: As directed    No wound care   Complete by: As directed      Allergies as of 05/06/2020   No Known Allergies     Medication List    TAKE these medications   acetaminophen 325 MG tablet Commonly known as: Tylenol Take 2 tablets (650 mg total) by mouth every 6 (six) hours as needed.   aspirin 81 MG EC tablet Take 1 tablet (81 mg total) by mouth 2 (two) times daily. Swallow whole. What changed:   when to take this  additional instructions   atorvastatin 20 MG tablet Commonly known as: LIPITOR Take 20  mg by mouth daily.   dorzolamide-timolol 22.3-6.8 MG/ML ophthalmic solution Commonly known as: COSOPT Place 1 drop into both eyes 2 (two) times daily.   levothyroxine 75 MCG tablet Commonly known as: SYNTHROID Take 75 mcg by mouth daily before breakfast.   lisinopril 5 MG tablet Commonly known as: ZESTRIL Take 1 tablet by mouth daily.       Contact information for after-discharge care    Destination    [facility] .   Service: Skilled Nursing Contact information: [address] [place] [address] [phone]                 No Known Allergies  Consultations:  Ortho surg    Procedures/Studies: DG Chest Port 1 View  Result Date: 04/27/2020 CLINICAL DATA:  Preoperative, unwitnessed fall EXAM: PORTABLE CHEST 1 VIEW COMPARISON:  Radiograph 10/27/2016 FINDINGS: Some mild pulmonary vascular congestion with hazy opacities, fissural and septal thickening could reflect early interstitial edema. Low volumes and atelectasis are suspected as well. Stable cardiomediastinal contours with a calcified, tortuous aorta. No pneumothorax or visible effusion. The osseous structures appear diffusely demineralized which may limit detection of small or nondisplaced fractures. No acute or worrisome osseous or soft tissue abnormalities of the chest. Stable benign macro calcifications in the breast tissues bilaterally, IMPRESSION: 1. Mild pulmonary vascular congestion. Faint hazy opacities, fissural and septal thickening could reflect early interstitial edema. 2. Low volumes and atelectasis. 3.  Aortic Atherosclerosis (ICD10-I70.0). Electronically Signed   By: Kreg Shropshire M.D.   On: 04/27/2020 03:11   DG HIP OPERATIVE UNILAT W OR W/O PELVIS RIGHT  Result Date: 04/27/2020 CLINICAL DATA:  RIGHT hip hemiarthroplasty EXAM: OPERATIVE RIGHT HIP (WITH PELVIS IF PERFORMED) 1 VIEWS TECHNIQUE: Fluoroscopic spot image(s) were submitted for interpretation post-operatively. COMPARISON:  Prior studies FINDINGS: RIGHT hip hemiarthroplasty identified without gross complicating features. IMPRESSION: RIGHT hip hemiarthroplasty without gross complicating features. Electronically Signed   By: Harmon Pier M.D.   On: 04/27/2020 13:22   DG Hip Unilat  With Pelvis 2-3 Views Right  Result Date: 04/27/2020 CLINICAL DATA:  Fall with hip pain, unwitnessed fall, right hip pain EXAM: DG HIP  (WITH OR WITHOUT PELVIS) 2-3V RIGHT COMPARISON:  Radiograph 10/27/2016, fluoroscopy 10/28/2016 FINDINGS: The osseous structures appear diffusely demineralized which may limit detection of small or nondisplaced fractures. Transcervical right femoral neck fracture with mild foreshortening across the fracture line. Surrounding soft tissue swelling is present. Evidence of prior transcervical pinning of the left femur without acute hardware complication. Femoral heads remain normally located. Remaining bones of the pelvis are intact and congruent. Degenerative changes in the hips, SI joints and symphysis pubis. IMPRESSION: 1. Transcervical right femoral neck fracture with mild foreshortening across the fracture line. Surrounding soft tissue swelling. 2. No other acute fracture or traumatic osseous injury. 3. Prior transcervical pinning of the left femur without acute complication. 4. Diffuse degenerative changes throughout the pelvis and hips. Electronically Signed   By: Kreg Shropshire M.D.   On: 04/27/2020 02:25     Subjective: Pt denies any complaints    Discharge Exam: Vitals:   05/06/20 0458 05/06/20 0728  BP: (!) 132/48 (!) 152/55  Pulse: 65 72  Resp: 16 16  Temp: 97.7 F (36.5 C) 97.6 F (36.4 C)  SpO2: 99% 100%   Vitals:   05/05/20 1604 05/05/20 2115 05/06/20 0458 05/06/20 0728  BP: (!) 111/52 (!) 139/56 (!) 132/48 (!) 152/55  Pulse: 74  75 65 72  Resp: 16 17 16 16   Temp: 98.3 F (36.8 C) 97.9 F (36.6 C) 97.7 F (36.5 C) 97.6 F (36.4 C)  TempSrc:  Oral  Oral  SpO2: 100% 99% 99% 100%  Weight:      Height:        General: Pt is alert, awake, not in acute distress Cardiovascular: S1/S2 +, no rubs, no gallops Respiratory: CTA bilaterally, no wheezing, no rhonchi Abdominal: Soft, NT, ND, bowel sounds + Extremities:no cyanosis    The results of significant diagnostics from this hospitalization (including imaging, microbiology, ancillary and laboratory) are listed below for  reference.     Microbiology: Recent Results (from the past 240 hour(s))  Resp Panel by RT-PCR (Flu A&B, Covid) Nasopharyngeal Swab     Status: None   Collection Time: 04/27/20  2:54 AM   Specimen: Nasopharyngeal Swab; Nasopharyngeal(NP) swabs in vial transport medium  Result Value Ref Range Status   SARS Coronavirus 2 by RT PCR NEGATIVE NEGATIVE Final    Comment: (NOTE) SARS-CoV-2 target nucleic acids are NOT DETECTED.  The SARS-CoV-2 RNA is generally detectable in upper respiratory specimens during the acute phase of infection. The lowest concentration of SARS-CoV-2 viral copies this assay can detect is 138 copies/mL. A negative result does not preclude SARS-Cov-2 infection and should not be used as the sole basis for treatment or other patient management decisions. A negative result may occur with  improper specimen collection/handling, submission of specimen other than nasopharyngeal swab, presence of viral mutation(s) within the areas targeted by this assay, and inadequate number of viral copies(<138 copies/mL). A negative result must be combined with clinical observations, patient history, and epidemiological information. The expected result is Negative.  Fact Sheet for Patients:  06/27/20  Fact Sheet for Healthcare Providers:  BloggerCourse.com  This test is no t yet approved or cleared by the [place] FDA and  has been authorized for detection and/or diagnosis of SARS-CoV-2 by FDA under an Emergency Use Authorization (EUA). This EUA will remain  in effect (meaning this test can be used) for the duration of the COVID-19 declaration under Section 564(b)(1) of the Act, 21 U.S.C.section 360bbb-3(b)(1), unless the authorization is terminated  or revoked sooner.       Influenza A by PCR NEGATIVE NEGATIVE Final   Influenza B by PCR NEGATIVE NEGATIVE Final    Comment: (NOTE) The Xpert Xpress SARS-CoV-2/FLU/RSV  plus assay is intended as an aid in the diagnosis of influenza from Nasopharyngeal swab specimens and should not be used as a sole basis for treatment. Nasal washings and aspirates are unacceptable for Xpert Xpress SARS-CoV-2/FLU/RSV testing.  Fact Sheet for Patients: [place]  Fact Sheet for Healthcare Providers: BloggerCourse.com  This test is not yet approved or cleared by the [place] FDA and has been authorized for detection and/or diagnosis of SARS-CoV-2 by FDA under an Emergency Use Authorization (EUA). This EUA will remain in effect (meaning this test can be used) for the duration of the COVID-19 declaration under Section 564(b)(1) of the Act, 21 U.S.C. section 360bbb-3(b)(1), unless the authorization is terminated or revoked.  Performed at [facility], [address] [place], [place]   SARS CORONAVIRUS 2 (TAT 6-24 HRS) Nasopharyngeal Nasopharyngeal Swab     Status: None   Collection Time: 05/04/20  4:07 PM   Specimen: Nasopharyngeal Swab  Result Value Ref Range Status   SARS Coronavirus 2 NEGATIVE NEGATIVE Final    Comment: (NOTE) SARS-CoV-2 target nucleic acids are NOT  DETECTED.  The SARS-CoV-2 RNA is generally detectable in upper and lower respiratory specimens during the acute phase of infection. Negative results do not preclude SARS-CoV-2 infection, do not rule out co-infections with other pathogens, and should not be used as the sole basis for treatment or other patient management decisions. Negative results must be combined with clinical observations, patient history, and epidemiological information. The expected result is Negative.  Fact Sheet for Patients: HairSlick.no  Fact Sheet for Healthcare Providers: quierodirigir.com  This test is not yet approved or cleared by the [place] FDA and  has been authorized for  detection and/or diagnosis of SARS-CoV-2 by FDA under an Emergency Use Authorization (EUA). This EUA will remain  in effect (meaning this test can be used) for the duration of the COVID-19 declaration under Se ction 564(b)(1) of the Act, 21 U.S.C. section 360bbb-3(b)(1), unless the authorization is terminated or revoked sooner.  Performed at [facility], [address], [place], [place] [address]      Labs: BNP (last 3 results) No results for input(s): BNP in the last 8760 hours. Basic Metabolic Panel: Recent Labs  Lab 05/02/20 0633 05/03/20 0510 05/04/20 0504 05/05/20 0616 05/06/20 0612  NA 135 132* 135 134* 133*  K 3.8 3.7 4.1 4.1 3.6  CL 103 104 105 103 103  CO2 GLUCOSE 111* 93 71 97 155*  BUN CREATININE 0.89 0.87 0.80 0.88 0.89  CALCIUM 8.1* 7.9* 8.2* 8.2* 8.3*   Liver Function Tests: No results for input(s): AST, ALT, ALKPHOS, BILITOT, PROT, ALBUMIN in the last 168 hours. No results for input(s): LIPASE, AMYLASE in the last 168 hours. No results for input(s): AMMONIA in the last 168 hours. CBC: Recent Labs  Lab 04/30/20 0509 05/01/20 0601 05/02/20 6045 05/03/20 0510 05/04/20 0504 05/05/20 0616 05/06/20 0612  WBC 6.6   < > 6.4 6.4 6.0 6.2 7.3  NEUTROABS 4.0  --   --   --   --   --   --   HGB 9.6*   < > 9.6* 9.9* 10.4* 9.7* 9.9*  HCT 28.8*   < > 28.4* 28.3* 29.9* 29.3* 29.1*  MCV 93.8   < > 93.4 92.8 92.0 92.7 93.3  PLT 219   < > 281 297 361 384 394   < > = values in this interval not displayed.   Cardiac Enzymes: No results for input(s): CKTOTAL, CKMB, CKMBINDEX, TROPONINI in the last 168 hours. BNP: Invalid input(s): POCBNP CBG: No results for input(s): GLUCAP in the last 168 hours. D-Dimer No results for input(s): DDIMER in the last 72 hours. Hgb A1c No results for input(s): HGBA1C in the last 72 hours. Lipid Profile No results for input(s): CHOL, HDL, LDLCALC, TRIG, CHOLHDL, LDLDIRECT in the last 72  hours. Thyroid function studies No results for input(s): TSH, T4TOTAL, T3FREE, THYROIDAB in the last 72 hours.  Invalid input(s): FREET3 Anemia work up No results for input(s): VITAMINB12, FOLATE, FERRITIN, TIBC, IRON, RETICCTPCT in the last 72 hours. Urinalysis No results found for: COLORURINE, APPEARANCEUR, LABSPEC, PHURINE, GLUCOSEU, HGBUR, BILIRUBINUR, KETONESUR, PROTEINUR, UROBILINOGEN, NITRITE, LEUKOCYTESUR Sepsis Labs Invalid input(s): PROCALCITONIN,  WBC,  LACTICIDVEN Microbiology Recent Results (from the past 240 hour(s))  Resp Panel by RT-PCR (Flu A&B, Covid) Nasopharyngeal Swab     Status: None   Collection Time: 04/27/20  2:54 AM   Specimen: Nasopharyngeal Swab; Nasopharyngeal(NP) swabs in vial transport medium  Result Value Ref Range Status  SARS Coronavirus 2 by RT PCR NEGATIVE NEGATIVE Final    Comment: (NOTE) SARS-CoV-2 target nucleic acids are NOT DETECTED.  The SARS-CoV-2 RNA is generally detectable in upper respiratory specimens during the acute phase of infection. The lowest concentration of SARS-CoV-2 viral copies this assay can detect is 138 copies/mL. A negative result does not preclude SARS-Cov-2 infection and should not be used as the sole basis for treatment or other patient management decisions. A negative result may occur with  improper specimen collection/handling, submission of specimen other than nasopharyngeal swab, presence of viral mutation(s) within the areas targeted by this assay, and inadequate number of viral copies(<138 copies/mL). A negative result must be combined with clinical observations, patient history, and epidemiological information. The expected result is Negative.  Fact Sheet for Patients:  BloggerCourse.com  Fact Sheet for Healthcare Providers:  SeriousBroker.it  This test is no t yet approved or cleared by the [place] FDA and  has been authorized for detection and/or  diagnosis of SARS-CoV-2 by FDA under an Emergency Use Authorization (EUA). This EUA will remain  in effect (meaning this test can be used) for the duration of the COVID-19 declaration under Section 564(b)(1) of the Act, 21 U.S.C.section 360bbb-3(b)(1), unless the authorization is terminated  or revoked sooner.       Influenza A by PCR NEGATIVE NEGATIVE Final   Influenza B by PCR NEGATIVE NEGATIVE Final    Comment: (NOTE) The Xpert Xpress SARS-CoV-2/FLU/RSV plus assay is intended as an aid in the diagnosis of influenza from Nasopharyngeal swab specimens and should not be used as a sole basis for treatment. Nasal washings and aspirates are unacceptable for Xpert Xpress SARS-CoV-2/FLU/RSV testing.  Fact Sheet for Patients: BloggerCourse.com  Fact Sheet for Healthcare Providers: SeriousBroker.it  This test is not yet approved or cleared by the [place] FDA and has been authorized for detection and/or diagnosis of SARS-CoV-2 by FDA under an Emergency Use Authorization (EUA). This EUA will remain in effect (meaning this test can be used) for the duration of the COVID-19 declaration under Section 564(b)(1) of the Act, 21 U.S.C. section 360bbb-3(b)(1), unless the authorization is terminated or revoked.  Performed at [facility], [address] [place], [place] [address]   SARS CORONAVIRUS 2 (TAT 6-24 HRS) Nasopharyngeal Nasopharyngeal Swab     Status: None   Collection Time: 05/04/20  4:07 PM   Specimen: Nasopharyngeal Swab  Result Value Ref Range Status   SARS Coronavirus 2 NEGATIVE NEGATIVE Final    Comment: (NOTE) SARS-CoV-2 target nucleic acids are NOT DETECTED.  The SARS-CoV-2 RNA is generally detectable in upper and lower respiratory specimens during the acute phase of infection. Negative results do not preclude SARS-CoV-2 infection, do not rule out co-infections with other pathogens, and should not be used  as the sole basis for treatment or other patient management decisions. Negative results must be combined with clinical observations, patient history, and epidemiological information. The expected result is Negative.  Fact Sheet for Patients: HairSlick.no  Fact Sheet for Healthcare Providers: quierodirigir.com  This test is not yet approved or cleared by the [place] FDA and  has been authorized for detection and/or diagnosis of SARS-CoV-2 by FDA under an Emergency Use Authorization (EUA). This EUA will remain  in effect (meaning this test can be used) for the duration of the COVID-19 declaration under Se ction 564(b)(1) of the Act, 21 U.S.C. section 360bbb-3(b)(1), unless the authorization is terminated or revoked sooner.  Performed at [facility], [address] [place], [place] [id number]      Time coordinating discharge: Over 30 minutes  SIGNED:   Charise KillianJamiese M Adriauna Campton, MD  [facility] 05/06/2020, 10:36 AM Pager   If 7PM-7AM, please contact night-coverage

## 2020-05-06 NOTE — Progress Notes (Signed)
Report given to [facility] at [facility].

## 2020-05-06 NOTE — Progress Notes (Signed)
Patient assisted to room bathroom with one assist and use of a wheelchair. Family informed that [facility] will be here at 1300 to transport patient to [facility].

## 2020-09-22 ENCOUNTER — Other Ambulatory Visit: Payer: Self-pay

## 2020-09-22 ENCOUNTER — Emergency Department: Payer: Medicare (Managed Care)

## 2020-09-22 ENCOUNTER — Emergency Department
Admission: EM | Admit: 2020-09-22 | Discharge: 2020-09-22 | Disposition: A | Discharge status: Home / Self Care | Payer: Medicare (Managed Care) | Attending: Emergency Medicine | Admitting: Emergency Medicine

## 2020-09-22 DIAGNOSIS — S0003XA Contusion of scalp, initial encounter: Secondary | ICD-10-CM | POA: Diagnosis not present

## 2020-09-22 DIAGNOSIS — E119 Type 2 diabetes mellitus without complications: Secondary | ICD-10-CM | POA: Diagnosis not present

## 2020-09-22 DIAGNOSIS — E039 Hypothyroidism, unspecified: Secondary | ICD-10-CM | POA: Insufficient documentation

## 2020-09-22 DIAGNOSIS — Z79899 Other long term (current) drug therapy: Secondary | ICD-10-CM | POA: Diagnosis not present

## 2020-09-22 DIAGNOSIS — W19XXXA Unspecified fall, initial encounter: Secondary | ICD-10-CM | POA: Diagnosis not present

## 2020-09-22 DIAGNOSIS — Z96641 Presence of right artificial hip joint: Secondary | ICD-10-CM | POA: Diagnosis not present

## 2020-09-22 DIAGNOSIS — Y92129 Unspecified place in nursing home as the place of occurrence of the external cause: Secondary | ICD-10-CM | POA: Insufficient documentation

## 2020-09-22 DIAGNOSIS — T148XXA Other injury of unspecified body region, initial encounter: Secondary | ICD-10-CM

## 2020-09-22 DIAGNOSIS — I10 Essential (primary) hypertension: Secondary | ICD-10-CM | POA: Diagnosis not present

## 2020-09-22 NOTE — ED Notes (Signed)
[facility] called for EMS transport 

## 2020-09-22 NOTE — ED Provider Notes (Signed)
[facility]  ____________________________________________   Event Date/Time   First MD Initiated Contact with Patient 09/22/20 1130     (approximate)  I have reviewed the triage vital signs and the nursing notes.   HISTORY  Chief Complaint Fall    HPI Ashley Cantrell is a 87 y.o. female Hypothyroidism, HTN, type 2 diabetes, hearing impairment, osteoporosis who presents after a fall.  Patient is coming from a skilled nursing facility.  RN here spoke with the facility PCP who noted that the patient had a fall last night and was found to have a hematoma on the posterior occiput today.  At baseline she is not alert and oriented.  She is notably at her neurologic baseline at this time.        Past Medical History:  Diagnosis Date   Anxiety    Diabetes mellitus without complication (HCC)    Essential hypertension    Glaucoma    Hard of hearing    severe   Thyroid disease     Patient Active Problem List   Diagnosis Date Noted   Hearing impairment 04/27/2020   Closed displaced fracture of right femoral neck (HCC) 04/27/2020   History of fracture of left hip 04/27/2020   Osteoporosis 04/27/2020   Unwitnessed fall 04/27/2020   Preoperative clearance 04/27/2020   Closed right hip fracture (HCC) 04/27/2020   Hip fracture (HCC) 10/27/2016   Type 2 diabetes, diet controlled (HCC) 12/26/2014   Essential hypertension 07/31/2013   Hypothyroidism 07/31/2013    Past Surgical History:  Procedure Laterality Date   CATARACT EXTRACTION     HIP PINNING,CANNULATED Left 10/28/2016   Procedure: CANNULATED HIP PINNING;  Surgeon: Lyndle Herrlich, MD;  Location: [facility] ORS;  Service: Orthopedics;  Laterality: Left;   TOTAL HIP ARTHROPLASTY Right 04/27/2020   Procedure: TOTAL HIP ARTHROPLASTY ANTERIOR APPROACH;  Surgeon: Lyndle Herrlich, MD;  Location: [facility] ORS;  Service: Orthopedics;  Laterality: Right;    Prior to Admission medications   Medication Sig Start Date End  Date Taking? Authorizing Provider  acetaminophen (TYLENOL) 325 MG tablet Take 2 tablets (650 mg total) by mouth every 6 (six) hours as needed. 10/30/16   Houston Siren, MD  atorvastatin (LIPITOR) 20 MG tablet Take 20 mg by mouth daily.    [provider]  dorzolamide-timolol (COSOPT) 22.3-6.8 MG/ML ophthalmic solution Place 1 drop into both eyes 2 (two) times daily.    [provider]  levothyroxine (SYNTHROID, LEVOTHROID) 75 MCG tablet Take 75 mcg by mouth daily before breakfast.    [provider]  lisinopril (ZESTRIL) 5 MG tablet Take 1 tablet by mouth daily. 03/31/20 03/31/21  [provider]    Allergies Patient has no known allergies.  Family History  Problem Relation Age of Onset   Breast cancer Neg Hx     Social History Social History   Tobacco Use   Smoking status: Never   Smokeless tobacco: Never  Vaping Use   Vaping Use: Never used  Substance Use Topics   Alcohol use: No    Review of Systems Unable to assess as pt is non-verbal   ____________________________________________   PHYSICAL EXAM:  VITAL SIGNS: ED Triage Vitals  Enc Vitals Group     BP 09/22/20 1129 (!) 143/85     Pulse Rate 09/22/20 1129 88     Resp 09/22/20 1129 20     Temp 09/22/20 1129 98.1 F (36.7 C)     Temp Source 09/22/20 1129  Oral     SpO2 09/22/20 1129 97 %     Weight 09/22/20 1130 121 lb (54.9 kg)     Height 09/22/20 1130 5\' 5"  (1.651 m)     Head Circumference --      Peak Flow --      Pain Score --      Pain Loc --      Pain Edu? --      Excl. in GC? --     Constitutional: Pt appears comfortable, lying in bed  Eyes: Conjunctivae are normal.  Head: Large hematoma on the posterior occiput Nose: No congestion/rhinnorhea. Mouth/Throat: Mucous membranes are moist.  Oropharynx non-erythematous. Neck: No stridor.   Cardiovascular: Normal rate, regular rhythm. Grossly normal heart sounds.  Good peripheral circulation. Respiratory: Normal  respiratory effort.  No retractions. Lungs CTAB. Gastrointestinal: Soft and nontender. No distention.  No CVA tenderness. Genitourinary: deferred Musculoskeletal: No lower extremity tenderness nor edema.  No joint effusions. Neurologic:  pts eyes are closed, grimaces and yells in response to stimulation, moves all extremities  Skin:  Skin is warm, dry and intact. No rash noted. Psychiatric: difficult to assess as pt is non-verbal   ____________________________________________   LABS (all labs ordered are listed, but only abnormal results are displayed)  Labs Reviewed - No data to display ____________________________________________  EKG  N/A ____________________________________________  RADIOLOGY , personally viewed and evaluated these images (plain radiographs) as part of my medical decision making, as well as reviewing the written report by the radiologist.  ED MD interpretation:       ____________________________________________   PROCEDURES  Procedure(s) performed (including Critical Care):  Procedures   ____________________________________________   INITIAL IMPRESSION / ASSESSMENT AND PLAN / ED COURSE    87 yo female who presents from he facility due to concern for possible head injury after a fall. Notably, pt is at her neurologic baseline per the PCP at the facility. VS wnl. + hemtoma on the posterior occiput, otherwise no signs of trauma. CT head and C spine obtained which do not show any acute pathology. Pt stable for dc back to her facility.       ____________________________________________   FINAL CLINICAL IMPRESSION(S) / ED DIAGNOSES  Final diagnoses:  Hematoma  Fall, initial encounter     ED Discharge Orders     None        Note:  This document was prepared using Dragon voice recognition software and may include unintentional dictation errors.    85, MD 09/22/20 [phone]

## 2020-09-22 NOTE — ED Notes (Signed)
Osborne Oman, staff at [facility] at this time.

## 2020-09-22 NOTE — ED Notes (Signed)
Dr. McHugh, EDP at bedside  

## 2020-09-22 NOTE — Discharge Instructions (Addendum)
Your CAT scan today did not show any abnormality in your brain or your cervical spine.

## 2020-09-22 NOTE — ED Notes (Signed)
Patient transported to CT 

## 2020-09-22 NOTE — ED Triage Notes (Addendum)
Pt via EMS from [facility]. Pt has mechanical fall last night and and this morning. Pt was transferring from bed to wheelchair. Pt did hit the back of her head. Pt has a hematoma to the posterior aspect of her head. Pt is alert but disoriented x4 which is baseline for her. Pt has no complaints at this time. Pt is not on any blood thinners. PCP from the facility called this RN and stated that they would only like a CT of head to be done because they only sent because of the hematoma and pt is at her baseline.

## 2022-01-10 ENCOUNTER — Emergency Department: Payer: Medicare (Managed Care)

## 2022-01-10 ENCOUNTER — Inpatient Hospital Stay: Payer: Medicare (Managed Care)

## 2022-01-10 ENCOUNTER — Inpatient Hospital Stay
Admission: EM | Admit: 2022-01-10 | Discharge: 2022-01-12 | DRG: 871 | Disposition: A | Discharge status: Skilled Nursing Facility | Payer: Medicare (Managed Care) | Source: Skilled Nursing Facility | Attending: Internal Medicine | Admitting: Internal Medicine

## 2022-01-10 DIAGNOSIS — I959 Hypotension, unspecified: Secondary | ICD-10-CM | POA: Diagnosis present

## 2022-01-10 DIAGNOSIS — E039 Hypothyroidism, unspecified: Secondary | ICD-10-CM | POA: Diagnosis present

## 2022-01-10 DIAGNOSIS — R531 Weakness: Secondary | ICD-10-CM | POA: Diagnosis not present

## 2022-01-10 DIAGNOSIS — I10 Essential (primary) hypertension: Secondary | ICD-10-CM | POA: Diagnosis present

## 2022-01-10 DIAGNOSIS — Z1152 Encounter for screening for COVID-19: Secondary | ICD-10-CM | POA: Diagnosis not present

## 2022-01-10 DIAGNOSIS — Z79899 Other long term (current) drug therapy: Secondary | ICD-10-CM

## 2022-01-10 DIAGNOSIS — R509 Fever, unspecified: Secondary | ICD-10-CM

## 2022-01-10 DIAGNOSIS — Z79890 Hormone replacement therapy (postmenopausal): Secondary | ICD-10-CM | POA: Diagnosis not present

## 2022-01-10 DIAGNOSIS — H9190 Unspecified hearing loss, unspecified ear: Secondary | ICD-10-CM | POA: Diagnosis present

## 2022-01-10 DIAGNOSIS — E785 Hyperlipidemia, unspecified: Secondary | ICD-10-CM | POA: Diagnosis present

## 2022-01-10 DIAGNOSIS — Z96641 Presence of right artificial hip joint: Secondary | ICD-10-CM | POA: Diagnosis present

## 2022-01-10 DIAGNOSIS — A419 Sepsis, unspecified organism: Secondary | ICD-10-CM | POA: Diagnosis not present

## 2022-01-10 DIAGNOSIS — F0390 Unspecified dementia without behavioral disturbance: Secondary | ICD-10-CM | POA: Diagnosis present

## 2022-01-10 DIAGNOSIS — E119 Type 2 diabetes mellitus without complications: Secondary | ICD-10-CM | POA: Diagnosis present

## 2022-01-10 DIAGNOSIS — H409 Unspecified glaucoma: Secondary | ICD-10-CM | POA: Diagnosis present

## 2022-01-10 DIAGNOSIS — E860 Dehydration: Secondary | ICD-10-CM | POA: Diagnosis present

## 2022-01-10 DIAGNOSIS — A0811 Acute gastroenteropathy due to Norwalk agent: Secondary | ICD-10-CM | POA: Diagnosis present

## 2022-01-10 DIAGNOSIS — R471 Dysarthria and anarthria: Secondary | ICD-10-CM | POA: Diagnosis present

## 2022-01-10 DIAGNOSIS — K5730 Diverticulosis of large intestine without perforation or abscess without bleeding: Secondary | ICD-10-CM | POA: Diagnosis present

## 2022-01-10 DIAGNOSIS — G9341 Metabolic encephalopathy: Secondary | ICD-10-CM | POA: Diagnosis not present

## 2022-01-10 DIAGNOSIS — Z66 Do not resuscitate: Secondary | ICD-10-CM | POA: Diagnosis present

## 2022-01-10 LAB — GASTROINTESTINAL PANEL BY PCR, STOOL (REPLACES STOOL CULTURE)

## 2022-01-10 LAB — CBG MONITORING, ED
Glucose-Capillary: 93 mg/dL (ref 70–99)
Glucose-Capillary: 92 mg/dL (ref 70–99)

## 2022-01-10 LAB — CBC WITH DIFFERENTIAL/PLATELET
Abs Immature Granulocytes: 0.03 10*3/uL (ref 0.00–0.07)
Basophils Absolute: 0 10*3/uL (ref 0.0–0.1)
Basophils Relative: 0 %
Eosinophils Absolute: 0 10*3/uL (ref 0.0–0.5)
Eosinophils Relative: 0 %
HCT: 31.8 % — ABNORMAL LOW (ref 36.0–46.0)
Hemoglobin: 10.5 g/dL — ABNORMAL LOW (ref 12.0–15.0)
Immature Granulocytes: 1 %
Lymphocytes Relative: 21 %
Lymphs Abs: 1.1 10*3/uL (ref 0.7–4.0)
MCH: 30.7 pg (ref 26.0–34.0)
MCHC: 33 g/dL (ref 30.0–36.0)
MCV: 93 fL (ref 80.0–100.0)
Monocytes Absolute: 0.5 10*3/uL (ref 0.1–1.0)
Monocytes Relative: 9 %
Neutro Abs: 3.6 10*3/uL (ref 1.7–7.7)
Neutrophils Relative %: 69 %
Platelets: 336 10*3/uL (ref 150–400)
RBC: 3.42 MIL/uL — ABNORMAL LOW (ref 3.87–5.11)
RDW: 14.8 % (ref 11.5–15.5)
WBC: 5.2 10*3/uL (ref 4.0–10.5)
nRBC: 0 % (ref 0.0–0.2)

## 2022-01-10 LAB — COMPREHENSIVE METABOLIC PANEL
ALT: 11 U/L (ref 0–44)
AST: 19 U/L (ref 15–41)
Albumin: 3 g/dL — ABNORMAL LOW (ref 3.5–5.0)
Alkaline Phosphatase: 84 U/L (ref 38–126)
Anion gap: 9 (ref 5–15)
BUN: 37 mg/dL — ABNORMAL HIGH (ref 8–23)
CO2: 22 mmol/L (ref 22–32)
Calcium: 8.4 mg/dL — ABNORMAL LOW (ref 8.9–10.3)
Chloride: 108 mmol/L (ref 98–111)
Creatinine, Ser: 1.15 mg/dL — ABNORMAL HIGH (ref 0.44–1.00)
GFR, Estimated: 44 mL/min — ABNORMAL LOW (ref >60)
Glucose, Bld: 146 mg/dL — ABNORMAL HIGH (ref 70–99)
Potassium: 4.1 mmol/L (ref 3.5–5.1)
Sodium: 139 mmol/L (ref 135–145)
Total Bilirubin: 0.8 mg/dL (ref 0.3–1.2)
Total Protein: 6.1 g/dL — ABNORMAL LOW (ref 6.5–8.1)

## 2022-01-10 LAB — PROTIME-INR
INR: 1.3 — ABNORMAL HIGH (ref 0.8–1.2)
Prothrombin Time: 16.2 seconds — ABNORMAL HIGH (ref 11.4–15.2)

## 2022-01-10 LAB — RESPIRATORY PANEL BY PCR

## 2022-01-10 LAB — T4, FREE: Free T4: 1.38 ng/dL — ABNORMAL HIGH (ref 0.61–1.12)

## 2022-01-10 LAB — RESP PANEL BY RT-PCR (FLU A&B, COVID) ARPGX2
Influenza A by PCR: NEGATIVE
Influenza B by PCR: NEGATIVE
SARS Coronavirus 2 by RT PCR: NEGATIVE

## 2022-01-10 LAB — URINALYSIS, COMPLETE (UACMP) WITH MICROSCOPIC
Bacteria, UA: NONE SEEN
Bilirubin Urine: NEGATIVE
Glucose, UA: NEGATIVE mg/dL
Hgb urine dipstick: NEGATIVE
Ketones, ur: NEGATIVE mg/dL
Leukocytes,Ua: NEGATIVE
Nitrite: NEGATIVE
Protein, ur: NEGATIVE mg/dL
Specific Gravity, Urine: 1.02 (ref 1.005–1.030)
Squamous Epithelial / HPF: NONE SEEN (ref 0–5)
pH: 5 (ref 5.0–8.0)

## 2022-01-10 LAB — GLUCOSE, CAPILLARY
Glucose-Capillary: 73 mg/dL (ref 70–99)
Glucose-Capillary: 90 mg/dL (ref 70–99)

## 2022-01-10 LAB — BASIC METABOLIC PANEL
Anion gap: 9 (ref 5–15)
BUN: 27 mg/dL — ABNORMAL HIGH (ref 8–23)
CO2: 20 mmol/L — ABNORMAL LOW (ref 22–32)
Calcium: 8.2 mg/dL — ABNORMAL LOW (ref 8.9–10.3)
Chloride: 111 mmol/L (ref 98–111)
Creatinine, Ser: 0.87 mg/dL (ref 0.44–1.00)
GFR, Estimated: >60 mL/min (ref >60)
Glucose, Bld: 78 mg/dL (ref 70–99)
Potassium: 3.7 mmol/L (ref 3.5–5.1)
Sodium: 140 mmol/L (ref 135–145)

## 2022-01-10 LAB — LACTIC ACID, PLASMA
Lactic Acid, Venous: 1.4 mmol/L (ref 0.5–1.9)
Lactic Acid, Venous: 1.3 mmol/L (ref 0.5–1.9)

## 2022-01-10 LAB — TSH: TSH: 0.299 u[IU]/mL — ABNORMAL LOW (ref 0.350–4.500)

## 2022-01-10 LAB — C DIFFICILE QUICK SCREEN W PCR REFLEX
C Diff antigen: NEGATIVE
C Diff interpretation: NOT DETECTED
C Diff toxin: NEGATIVE

## 2022-01-10 LAB — PHOSPHORUS: Phosphorus: 3.1 mg/dL (ref 2.5–4.6)

## 2022-01-10 LAB — PROCALCITONIN: Procalcitonin: <0.1 ng/mL

## 2022-01-10 LAB — TROPONIN I (HIGH SENSITIVITY)
Troponin I (High Sensitivity): 17 ng/L (ref <18)
Troponin I (High Sensitivity): 24 ng/L — ABNORMAL HIGH (ref <18)

## 2022-01-10 LAB — BRAIN NATRIURETIC PEPTIDE: B Natriuretic Peptide: 189.4 pg/mL — ABNORMAL HIGH (ref 0.0–100.0)

## 2022-01-10 LAB — LIPASE, BLOOD: Lipase: 29 U/L (ref 11–51)

## 2022-01-10 LAB — MAGNESIUM
Magnesium: 1.9 mg/dL (ref 1.7–2.4)
Magnesium: 1.9 mg/dL (ref 1.7–2.4)

## 2022-01-10 LAB — APTT: aPTT: 30 seconds (ref 24–36)

## 2022-01-10 MED ORDER — SODIUM CHLORIDE 0.9 % IV SOLN
2.0000 g | INTRAVENOUS | Status: DC
  Administered 2022-01-11: 2 g via INTRAVENOUS
  Filled 2022-01-10: qty 20

## 2022-01-10 MED ORDER — LACTATED RINGERS IV SOLN: INTRAVENOUS | Status: AC

## 2022-01-10 MED ORDER — SODIUM CHLORIDE 0.9 % IV SOLN
500.0000 mg | INTRAVENOUS | Status: DC
  Administered 2022-01-11: 500 mg via INTRAVENOUS
  Filled 2022-01-10 (×2): qty 5

## 2022-01-10 MED ORDER — SODIUM CHLORIDE 0.9 % IV SOLN: 500.0000 mg | INTRAVENOUS | Status: DC

## 2022-01-10 MED ORDER — IOHEXOL 300 MG/ML  SOLN
80.0000 mL | Freq: Once | INTRAMUSCULAR | Status: AC | PRN
  Administered 2022-01-10: 80 mL via INTRAVENOUS

## 2022-01-10 MED ORDER — SODIUM CHLORIDE 0.9 % IV SOLN
500.0000 mg | Freq: Once | INTRAVENOUS | Status: AC
  Administered 2022-01-10: 500 mg via INTRAVENOUS
  Filled 2022-01-10: qty 5

## 2022-01-10 MED ORDER — INSULIN ASPART 100 UNIT/ML IJ SOLN: 0.0000 [IU] | Freq: Every day | INTRAMUSCULAR | Status: DC

## 2022-01-10 MED ORDER — ONDANSETRON HCL 4 MG PO TABS: 4.0000 mg | ORAL_TABLET | Freq: Four times a day (QID) | ORAL | Status: DC | PRN

## 2022-01-10 MED ORDER — INSULIN ASPART 100 UNIT/ML IJ SOLN: 0.0000 [IU] | Freq: Three times a day (TID) | INTRAMUSCULAR | Status: DC

## 2022-01-10 MED ORDER — ONDANSETRON HCL 4 MG/2ML IJ SOLN: 4.0000 mg | Freq: Four times a day (QID) | INTRAMUSCULAR | Status: DC | PRN

## 2022-01-10 MED ORDER — ATORVASTATIN CALCIUM 20 MG PO TABS
20.0000 mg | ORAL_TABLET | Freq: Every day | ORAL | Status: DC
  Administered 2022-01-10 – 2022-01-12 (×3): 20 mg via ORAL
  Filled 2022-01-10 (×3): qty 1

## 2022-01-10 MED ORDER — ENOXAPARIN SODIUM 40 MG/0.4ML IJ SOSY
40.0000 mg | PREFILLED_SYRINGE | INTRAMUSCULAR | Status: DC
  Administered 2022-01-10 – 2022-01-12 (×3): 40 mg via SUBCUTANEOUS
  Filled 2022-01-10 (×3): qty 0.4

## 2022-01-10 MED ORDER — LACTATED RINGERS IV BOLUS (SEPSIS)
1000.0000 mL | Freq: Once | INTRAVENOUS | Status: AC
  Administered 2022-01-10: 1000 mL via INTRAVENOUS

## 2022-01-10 MED ORDER — ACETAMINOPHEN 650 MG RE SUPP: 650.0000 mg | Freq: Four times a day (QID) | RECTAL | Status: DC | PRN

## 2022-01-10 MED ORDER — DORZOLAMIDE HCL-TIMOLOL MAL 2-0.5 % OP SOLN
1.0000 [drp] | Freq: Two times a day (BID) | OPHTHALMIC | Status: DC
  Administered 2022-01-10 – 2022-01-12 (×5): 1 [drp] via OPHTHALMIC
  Filled 2022-01-10 (×3): qty 10

## 2022-01-10 MED ORDER — SODIUM CHLORIDE 0.9 % IV SOLN
2.0000 g | INTRAVENOUS | Status: DC
  Administered 2022-01-10: 2 g via INTRAVENOUS
  Filled 2022-01-10: qty 20

## 2022-01-10 MED ORDER — POLYETHYLENE GLYCOL 3350 17 G PO PACK: 17.0000 g | PACK | Freq: Every day | ORAL | Status: DC | PRN

## 2022-01-10 MED ORDER — ACETAMINOPHEN 325 MG PO TABS: 650.0000 mg | ORAL_TABLET | Freq: Four times a day (QID) | ORAL | Status: DC | PRN

## 2022-01-10 NOTE — ED Notes (Signed)
Kathy from pt's SNF called to give report. Pt was slurring speech, mumbling, and not acting like herself. Pt normally ambulates with a walker but r leg gave out today. Pt had right sided facial droop per nurses. Pt also running a fever of 100.3 and unwilling to take pills. Pt normally takes pills. All four numbers for pt's families tried and no one answered.

## 2022-01-10 NOTE — Assessment & Plan Note (Signed)
Patient has diet-controlled diabetes and not on any medications at home. -Monitor with CBG and sensitive SSI if needed

## 2022-01-10 NOTE — Assessment & Plan Note (Signed)
-  Continue home Lipitor 

## 2022-01-10 NOTE — Progress Notes (Signed)
Following for sepsis monitoring ?

## 2022-01-10 NOTE — Assessment & Plan Note (Addendum)
Patient met sepsis criteria with being febrile at 101, tachycardia and tachypnea, blood pressure was soft which improved with IV fluid. UA is not consistent with UTI, COVID-19 and influenza PCR negative.  Chest x-ray concerning for pneumonia but CT more consistent with atelectasis. Might be some viral illness.  Urine and blood cultures drawn in ED and pending. Procalcitonin negative. -Check respiratory viral panel. -Check strep pneumo and Legionella antigen -Sputum culture -Follow-up blood and urine cultures. -We will continue with ceftriaxone and Zithromax at this time-can discontinue if viral panel is positive and no other source of infection found.

## 2022-01-10 NOTE — ED Triage Notes (Signed)
Per EMS, SNF thought pt was having a stroke due to not being combative. Pt running a fever of 100.4.

## 2022-01-10 NOTE — Progress Notes (Signed)
Notified by central Tele, pt had 7-8 beats V-tach. MD notified. MD to order repeat BNP and Mg

## 2022-01-10 NOTE — ED Notes (Signed)
Facility called to check on pt, states that pt is normally confused as it at most oriented to person, states that she normally walks with a walker, states that they sent her to the er around 4am because they thought that she was having a stroke, states that she was dragging her L foot. MD notified

## 2022-01-10 NOTE — ED Notes (Signed)
Pt had numerous sticks to get Ivs for blood cultures and to give IV fluids. Pt unable to follow commands to keep arms straight, so arm board applied to right arm. Pt still unable to keep arm straight.

## 2022-01-10 NOTE — Hospital Course (Addendum)
Ashley Cantrell is a 88 y.o. female past medical history significant for dementia, diabetes, hypothyroidism, hyperlipidemia, hypertension, presents to the emergency department with altered mental status.  Initially called out to nursing facility for possible stroke however and found to have a fever and low blood pressure.   ED course.  Patient was febrile at 101, heart rate 96, respiratory rate 31 and blood pressure Of 101/43.  Responded appropriately to IV fluid.  Labs with no leukocytosis, hemoglobin 10.5 which is at baseline, BUN 37, creatinine 1.15, baseline around 0.8, INR 1.3, UA negative, TSH low at 0.299, free T4 elevated at 1.38, COVID and influenza PCR negative, PCT negative.  Chest x-ray with an ill-defined opacity in the periphery of the left lung base which may reflect atelectasis versus consolidation with superimposed small left pleural effusion. CT abdomen and pelvis shows mild scarring in the right lower lobe, cholelithiasis without cholecystitis.  Colonic diverticulosis without diverticulitis.  Diffuse periportal edema in the liver which is nonspecific, and an unusual elongated low attenuation lesion in the liver which may simply represent a cyst.  Please see the full report. CT head was negative for any acute abnormality. EKG with sinus rhythm and no significant abnormality.  Patient later developed watery diarrhea, C. difficile and GI pathogen panel ordered  11/20: Vitals stable, GI pathogen panel positive for norovirus, C. difficile negative.  Labs with hemoglobin 9, decreased from 10, no bleeding, most likely dilutional as all cell lines decreased.  Renal function improved with BUN of 26 and creatinine of 0.94 with IV fluid. Respiratory viral panel negative.  Mental status appears to be at baseline now. Procalcitonin negative.  Discontinuing antibiotics.  No upper respiratory symptoms Giving some more IV fluid as she continued to have significant diarrhea. Should be able to go back  to her facility tomorrow if remains stable.  11/21: Blood pressure mildly elevated, increasing the home dose of lisinopril from 2.5 to 5 mg daily.  Keep holding home Lasix of 20 mg daily until diarrhea completely resolves. Keep holding home Synthroid and recheck TSH and free T4, can restart at a lower dose when appropriate.  Labs remained stable.  Diarrhea much improved but if needed she can use Imodium.  Please encourage p.o. hydration.  Patient is being discharged on current medications except with the changes as mentioned above and need to have a follow-up with her providers for further recommendations.

## 2022-01-10 NOTE — Assessment & Plan Note (Signed)
Patient with altered mental status which was initially thought to be a stroke but CT head was negative.  No focal weakness. Ongoing work-up to rule out sepsis versus viral illness and dehydration. Also checking troponin -Admitted to medical telemetry -Continue with neurochecks

## 2022-01-10 NOTE — ED Notes (Signed)
ED TO INPATIENT HANDOFF REPORT  ED Nurse Name and Phone #: Ned Clines Name/Age/Gender Ashley Cantrell 88 y.o. female Room/Bed: ED08A/ED08A  Code Status   Code Status: DNR  Home/SNF/Other Skilled nursing facility Not oriented Is this baseline? Yes   Triage Complete: Triage complete  Chief Complaint Acute metabolic encephalopathy [G93.41]  Triage Note Per EMS, SNF thought pt was having a stroke due to not being combative. Pt running a fever of 100.4.   Allergies No Known Allergies  Level of Care/Admitting Diagnosis ED Disposition     ED Disposition  Admit   Condition  --   Comment  Hospital Area: [facility] [[id number]]  Level of Care: Telemetry Medical [[id number]]  Covid Evaluation: Confirmed COVID Negative  Diagnosis: Acute metabolic encephalopathy [1478295]  Admitting Physician: Arnetha Courser [[id number]]  Attending Physician: Arnetha Courser [phone]  Certification:: I certify this patient will need inpatient services for at least 2 midnights  Estimated Length of Stay: 2          B Medical/Surgery History Past Medical History:  Diagnosis Date   Anxiety    Diabetes mellitus without complication (HCC)    Essential hypertension    Glaucoma    Hard of hearing    severe   Thyroid disease    Past Surgical History:  Procedure Laterality Date   CATARACT EXTRACTION     HIP PINNING,CANNULATED Left 10/28/2016   Procedure: CANNULATED HIP PINNING;  Surgeon: Lyndle Herrlich, MD;  Location: [facility] ORS;  Service: Orthopedics;  Laterality: Left;   TOTAL HIP ARTHROPLASTY Right 04/27/2020   Procedure: TOTAL HIP ARTHROPLASTY ANTERIOR APPROACH;  Surgeon: Lyndle Herrlich, MD;  Location: [facility] ORS;  Service: Orthopedics;  Laterality: Right;     A IV Location/Drains/Wounds Patient Lines/Drains/Airways Status     Active Line/Drains/Airways     Name Placement date Placement time Site Days   Peripheral IV 01/10/22 20 G Left Antecubital 01/10/22  0458  Antecubital   less than 1   Incision (Closed) 10/28/16 Hip Left 10/28/16  1328  -- 1900   Incision (Closed) 04/27/20 Hip Right 04/27/20  1131  -- 623            Intake/Output Last 24 hours  Intake/Output Summary (Last 24 hours) at 01/10/2022 1231 Last data filed at 01/10/2022 1109 Gross per 24 hour  Intake 2250.03 ml  Output --  Net 2250.03 ml    Labs/Imaging Results for orders placed or performed during the hospital encounter of 01/10/22 (from the past 48 hour(s))  Lactic acid, plasma     Status: None   Collection Time: 01/10/22  5:05 AM  Result Value Ref Range   Lactic Acid, Venous 1.4 0.5 - 1.9 mmol/L    Comment: Performed at [facility], [address] [place], [place] [address]  Comprehensive metabolic panel     Status: Abnormal   Collection Time: 01/10/22  5:05 AM  Result Value Ref Range   Sodium 139 135 - 145 mmol/L   Potassium 4.1 3.5 - 5.1 mmol/L   Chloride 108 98 - 111 mmol/L   CO2 22 22 - 32 mmol/L   Glucose, Bld 146 (H) 70 - 99 mg/dL    Comment: Glucose reference range applies only to samples taken after fasting for at least 8 hours.   BUN 37 (H) 8 - 23 mg/dL   Creatinine, Ser 4.13 (H) 0.44 - 1.00 mg/dL   Calcium 8.4 (L) 8.9 - 10.3 mg/dL   Total Protein 6.1 (L)  6.5 - 8.1 g/dL   Albumin 3.0 (L) 3.5 - 5.0 g/dL   AST 19 15 - 41 U/L   ALT 11 0 - 44 U/L   Alkaline Phosphatase 84 38 - 126 U/L   Total Bilirubin 0.8 0.3 - 1.2 mg/dL   GFR, Estimated 44 (L) >60 mL/min    Comment: (NOTE) Calculated using the CKD-EPI Creatinine Equation (2021)    Anion gap 9 5 - 15    Comment: Performed at [facility], [address] [place], [place] [address]  CBC with Differential     Status: Abnormal   Collection Time: 01/10/22  5:05 AM  Result Value Ref Range   WBC 5.2 4.0 - 10.5 K/uL   RBC 3.42 (L) 3.87 - 5.11 MIL/uL   Hemoglobin 10.5 (L) 12.0 - 15.0 g/dL   HCT 60.4 (L) 54.0 - 98.1 %   MCV 93.0 80.0 - 100.0 fL   MCH 30.7 26.0 - 34.0 pg   MCHC 33.0 30.0 - 36.0  g/dL   RDW 19.1 47.8 - 29.5 %   Platelets 336 150 - 400 K/uL   nRBC 0.0 0.0 - 0.2 %   Neutrophils Relative % 69 %   Neutro Abs 3.6 1.7 - 7.7 K/uL   Lymphocytes Relative 21 %   Lymphs Abs 1.1 0.7 - 4.0 K/uL   Monocytes Relative 9 %   Monocytes Absolute 0.5 0.1 - 1.0 K/uL   Eosinophils Relative 0 %   Eosinophils Absolute 0.0 0.0 - 0.5 K/uL   Basophils Relative 0 %   Basophils Absolute 0.0 0.0 - 0.1 K/uL   Immature Granulocytes 1 %   Abs Immature Granulocytes 0.03 0.00 - 0.07 K/uL    Comment: Performed at [facility], [address] [place], [place] [address]  Protime-INR     Status: Abnormal   Collection Time: 01/10/22  5:05 AM  Result Value Ref Range   Prothrombin Time 16.2 (H) 11.4 - 15.2 seconds   INR 1.3 (H) 0.8 - 1.2    Comment: (NOTE) INR goal varies based on device and disease states. Performed at [facility], [address] [place], [place] [address]   APTT     Status: None   Collection Time: 01/10/22  5:05 AM  Result Value Ref Range   aPTT 30 24 - 36 seconds    Comment: Performed at [facility], [address] [place], [place] [address]  Urinalysis, Complete w Microscopic Urine, Catheterized     Status: Abnormal   Collection Time: 01/10/22  5:05 AM  Result Value Ref Range   Color, Urine YELLOW (A) YELLOW   APPearance HAZY (A) CLEAR   Specific Gravity, Urine 1.020 1.005 - 1.030   pH 5.0 5.0 - 8.0   Glucose, UA NEGATIVE NEGATIVE mg/dL   Hgb urine dipstick NEGATIVE NEGATIVE   Bilirubin Urine NEGATIVE NEGATIVE   Ketones, ur NEGATIVE NEGATIVE mg/dL   Protein, ur NEGATIVE NEGATIVE mg/dL   Nitrite NEGATIVE NEGATIVE   Leukocytes,Ua NEGATIVE NEGATIVE   RBC / HPF 6-10 0 - 5 RBC/hpf   WBC, UA 0-5 0 - 5 WBC/hpf   Bacteria, UA NONE SEEN NONE SEEN   Squamous Epithelial / LPF NONE SEEN 0 - 5   Mucus PRESENT    Hyaline Casts, UA PRESENT     Comment: Performed at [facility], [address] [place], [place] [address]  Lipase,  blood     Status: None   Collection Time: 01/10/22  5:05  AM  Result Value Ref Range   Lipase 29 11 - 51 U/L    Comment: Performed at [facility], [address] [place], [place] [address]  TSH     Status: Abnormal   Collection Time: 01/10/22  5:05 AM  Result Value Ref Range   TSH 0.299 (L) 0.350 - 4.500 uIU/mL    Comment: Performed by a 3rd Generation assay with a functional sensitivity of <=0.01 uIU/mL. Performed at [facility], [address] [place], [place] [address]   T4, free     Status: Abnormal   Collection Time: 01/10/22  5:05 AM  Result Value Ref Range   Free T4 1.38 (H) 0.61 - 1.12 ng/dL    Comment: (NOTE) Biotin ingestion may interfere with free T4 tests. If the results are inconsistent with the TSH level, previous test results, or the clinical presentation, then consider biotin interference. If needed, order repeat testing after stopping biotin. Performed at [facility], [address] [place], [place] [address]   Brain natriuretic peptide     Status: Abnormal   Collection Time: 01/10/22  5:05 AM  Result Value Ref Range   B Natriuretic Peptide 189.4 (H) 0.0 - 100.0 pg/mL    Comment: Performed at [facility], [address] [place], [place] [address]  Procalcitonin - Baseline     Status: None   Collection Time: 01/10/22  5:17 AM  Result Value Ref Range   Procalcitonin <0.10 ng/mL    Comment:        Interpretation: PCT (Procalcitonin) <= 0.5 ng/mL: Systemic infection (sepsis) is not likely. Local bacterial infection is possible. (NOTE)       Sepsis PCT Algorithm           Lower Respiratory Tract                                      Infection PCT Algorithm    ----------------------------     ----------------------------         PCT < 0.25 ng/mL                PCT < 0.10 ng/mL          Strongly encourage             Strongly discourage   discontinuation of antibiotics    initiation of antibiotics     ----------------------------     -----------------------------       PCT 0.25 - 0.50 ng/mL            PCT 0.10 - 0.25 ng/mL               OR       >80% decrease in PCT            Discourage initiation of                                            antibiotics      Encourage discontinuation           of antibiotics    ----------------------------     -----------------------------         PCT >= 0.50 ng/mL              PCT 0.26 - 0.50 ng/mL  AND        <80% decrease in PCT             Encourage initiation of                                             antibiotics       Encourage continuation           of antibiotics    ----------------------------     -----------------------------        PCT >= 0.50 ng/mL                  PCT > 0.50 ng/mL               AND         increase in PCT                  Strongly encourage                                      initiation of antibiotics    Strongly encourage escalation           of antibiotics                                     -----------------------------                                           PCT <= 0.25 ng/mL                                                 OR                                        > 80% decrease in PCT                                      Discontinue / Do not initiate                                             antibiotics  Performed at [facility], [address] [place], [place] [address]   Resp Panel by RT-PCR (Flu A&B, Covid) Anterior Nasal Swab     Status: None   Collection Time: 01/10/22  6:03 AM   Specimen: Anterior Nasal Swab  Result Value Ref Range   SARS Coronavirus 2 by RT PCR NEGATIVE NEGATIVE    Comment: (NOTE) SARS-CoV-2 target nucleic acids are NOT DETECTED.  The SARS-CoV-2 RNA is generally detectable in upper respiratory specimens during the acute phase of infection. The lowest concentration of SARS-CoV-2 viral copies this assay can detect is  138 copies/mL. A negative  result does not preclude SARS-Cov-2 infection and should not be used as the sole basis for treatment or other patient management decisions. A negative result may occur with  improper specimen collection/handling, submission of specimen other than nasopharyngeal swab, presence of viral mutation(s) within the areas targeted by this assay, and inadequate number of viral copies(<138 copies/mL). A negative result must be combined with clinical observations, patient history, and epidemiological information. The expected result is Negative.  Fact Sheet for Patients:  BloggerCourse.comhttps://www.fda.gov/media/152166/download  Fact Sheet for Healthcare Providers:  SeriousBroker.ithttps://www.fda.gov/media/152162/download  This test is no t yet approved or cleared by the [place] FDA and  has been authorized for detection and/or diagnosis of SARS-CoV-2 by FDA under an Emergency Use Authorization (EUA). This EUA will remain  in effect (meaning this test can be used) for the duration of the COVID-19 declaration under Section 564(b)(1) of the Act, 21 U.S.C.section 360bbb-3(b)(1), unless the authorization is terminated  or revoked sooner.       Influenza A by PCR NEGATIVE NEGATIVE   Influenza B by PCR NEGATIVE NEGATIVE    Comment: (NOTE) The Xpert Xpress SARS-CoV-2/FLU/RSV plus assay is intended as an aid in the diagnosis of influenza from Nasopharyngeal swab specimens and should not be used as a sole basis for treatment. Nasal washings and aspirates are unacceptable for Xpert Xpress SARS-CoV-2/FLU/RSV testing.  Fact Sheet for Patients: BloggerCourse.comhttps://www.fda.gov/media/152166/download  Fact Sheet for Healthcare Providers: SeriousBroker.ithttps://www.fda.gov/media/152162/download  This test is not yet approved or cleared by the [place] FDA and has been authorized for detection and/or diagnosis of SARS-CoV-2 by FDA under an Emergency Use Authorization (EUA). This EUA will remain in effect (meaning this test can be used) for the  duration of the COVID-19 declaration under Section 564(b)(1) of the Act, 21 U.S.C. section 360bbb-3(b)(1), unless the authorization is terminated or revoked.  Performed at [facility], [address] [place], [place] [address]   Lactic acid, plasma     Status: None   Collection Time: 01/10/22  7:31 AM  Result Value Ref Range   Lactic Acid, Venous 1.3 0.5 - 1.9 mmol/L    Comment: Performed at [facility], [address] [place], [place] [address]  CBG monitoring, ED     Status: None   Collection Time: 01/10/22  9:32 AM  Result Value Ref Range   Glucose-Capillary 93 70 - 99 mg/dL    Comment: Glucose reference range applies only to samples taken after fasting for at least 8 hours.  Troponin I (High Sensitivity)     Status: None   Collection Time: 01/10/22  9:40 AM  Result Value Ref Range   Troponin I (High Sensitivity) 17 <18 ng/L    Comment: (NOTE) Elevated high sensitivity troponin I (hsTnI) values and significant  changes across serial measurements may suggest ACS but many other  chronic and acute conditions are known to elevate hsTnI results.  Refer to the "Links" section for chest pain algorithms and additional  guidance. Performed at [facility], [address] [place], [place] [address]   Phosphorus     Status: None   Collection Time: 01/10/22  9:40 AM  Result Value Ref Range   Phosphorus 3.1 2.5 - 4.6 mg/dL    Comment: Performed at [facility], [address] [place], [place] [address]  Magnesium     Status: None   Collection Time: 01/10/22  9:40 AM  Result Value Ref Range   Magnesium 1.9 1.7 - 2.4 mg/dL  Comment: Performed at [facility], [address] [place], [place] [address]  CBG monitoring, ED     Status: None   Collection Time: 01/10/22 11:21 AM  Result Value Ref Range   Glucose-Capillary 92 70 - 99 mg/dL    Comment: Glucose reference range applies only to samples taken after fasting for at least 8  hours.   MR BRAIN WO CONTRAST  Result Date: 01/10/2022 CLINICAL DATA:  Mental status change, persistent or worsening. EXAM: MRI HEAD WITHOUT CONTRAST TECHNIQUE: Multiplanar, multiecho pulse sequences of the brain and surrounding structures were obtained without intravenous contrast. COMPARISON:  Head CT from earlier today FINDINGS: Brain: No acute infarction, hemorrhage, hydrocephalus, extra-axial collection or mass lesion. On axial T2 weighted imaging apparent T2 hyperintensity in the upper and ventral medulla is likely from volume averaging at the level of the pontine medullary junction based on the thinnest T1 weighted images and coronal sequences. Chronic small vessel ischemia to a moderate degree in the periventricular white matter. Small remote cerebellar infarcts. Age normal brain volume. Vascular: Normal flow voids. Skull and upper cervical spine: Normal marrow signal. Sinuses/Orbits: Left cataract resection.  Clear sinuses. IMPRESSION: 1. No acute infarct or other specific cause for symptoms. 2. Chronic small vessel ischemia and small remote cerebellar infarcts. Electronically Signed   By: Tiburcio Pea M.D.   On: 01/10/2022 12:02   CT Abdomen Pelvis W Contrast  Result Date: 01/10/2022 CLINICAL DATA:  88 year old female with history of acute onset of nonlocalized abdominal pain. EXAM: CT ABDOMEN AND PELVIS WITH CONTRAST TECHNIQUE: Multidetector CT imaging of the abdomen and pelvis was performed using the standard protocol following bolus administration of intravenous contrast. RADIATION DOSE REDUCTION: This exam was performed according to the departmental dose-optimization program which includes automated exposure control, adjustment of the mA and/or kV according to patient size and/or use of iterative reconstruction technique. CONTRAST:  80mL OMNIPAQUE IOHEXOL 300 MG/ML  SOLN COMPARISON:  No priors. FINDINGS: Lower chest: Mild scarring in the left lower lobe. Atherosclerotic calcifications in  the thoracic aorta. Mild calcification of the aortic valve. Atherosclerotic calcifications also noted in the left main, left anterior descending, left circumflex and right coronary arteries. Small hiatal hernia. Hepatobiliary: Mild diffuse periportal edema in the liver. In segment 2 of the liver (axial images 18 and 19 of series 2) there is an elongated low-attenuation lesion estimated to measure approximately 4.3 x 1.0 cm, which may simply represent an ovoid shaped cysts, however, the possibility of focal intrahepatic biliary ductal dilatation in this region is not entirely excluded. No other intra or extrahepatic biliary ductal dilatation is noted. Multiple large calcified gallstones in the fundus of the gallbladder, measuring up to 3 cm in diameter. Gallbladder is moderately distended. No gallbladder wall thickening or pericholecystic fluid or surrounding inflammatory changes to indicate an acute cholecystitis at this time. Pancreas: No pancreatic mass. No pancreatic ductal dilatation. No pancreatic or peripancreatic fluid collections or inflammatory changes. Spleen: Unremarkable. Adrenals/Urinary Tract: Bilateral kidneys and bilateral adrenal glands are normal in appearance. No hydroureteronephrosis. Urinary bladder is partially obscured by extensive beam hardening artifact from bilateral pelvic hardware, but visualized portions are unremarkable. Stomach/Bowel: Stomach is nearly completely decompressed, but otherwise unremarkable in appearance. No pathologic dilatation of small bowel or colon. Numerous colonic diverticuli are noted, particularly in the sigmoid colon, without definite focal surrounding inflammatory changes to indicate an acute diverticulitis at this time. Normal appendix. Vascular/Lymphatic: Aortic atherosclerosis, without evidence of aneurysm or dissection in the abdominal or pelvic vasculature. No  lymphadenopathy noted in the abdomen or pelvis. Reproductive: Status post hysterectomy. Ovaries  are not confidently identified may be surgically absent or atrophic. Other: No significant volume of ascites.  No pneumoperitoneum. Musculoskeletal: Status post right hip arthroplasty. Status post ORIF in the left femoral neck where there are 3 cannulated fixation screws. There are no aggressive appearing lytic or blastic lesions noted in the visualized portions of the skeleton. IMPRESSION: 1. Cholelithiasis without definitive evidence of acute cholecystitis at this time. 2. Diffuse periportal edema in the liver. This is a nonspecific finding, but correlation with liver function tests is recommended. 3. Unusual elongated low-attenuation lesion in segment 2 of the liver. This may simply represent a cyst or adjacent small cysts, however, the possibility of peripheral intrahepatic biliary ductal dilatation is not excluded. If there is clinical concern for biliary obstruction, further evaluation with abdominal MRI with and without IV gadolinium with MRCP could be considered to exclude the possibility of centrally obstructing neoplasm (none definitively visualized by CT imaging). 4. Aortic atherosclerosis, in addition to left main and three-vessel coronary artery disease. 5. Colonic diverticulosis without evidence of acute diverticulitis at this time. 6. Additional incidental findings, as above. Electronically Signed   By: Trudie Reed M.D.   On: 01/10/2022 07:05   CT Head Wo Contrast  Result Date: 01/10/2022 CLINICAL DATA:  Mental status change EXAM: CT HEAD WITHOUT CONTRAST TECHNIQUE: Contiguous axial images were obtained from the base of the skull through the vertex without intravenous contrast. RADIATION DOSE REDUCTION: This exam was performed according to the departmental dose-optimization program which includes automated exposure control, adjustment of the mA and/or kV according to patient size and/or use of iterative reconstruction technique. COMPARISON:  None Available. FINDINGS: Brain: No evidence of  acute infarction, hemorrhage, hydrocephalus, extra-axial collection or mass lesion/mass effect. Scattered areas of periventricular and subcortical white matter hypoattenuation are noted. One hundred specific, findings are most consistent with chronic microvascular ischemic white matter disease. Vascular: No hyperdense vessel or unexpected calcification. Skull: Negative for fracture or focal lesion. Hyperostosis frontalis interna. Sinuses/Orbits: No acute finding. Other: None. IMPRESSION: 1. No acute intracranial abnormality. 2. Chronic microvascular ischemic white matter changes. Electronically Signed   By: Malachy Moan M.D.   On: 01/10/2022 07:00   DG Chest Port 1 View  Result Date: 01/10/2022 CLINICAL DATA:  88 year old female with possible sepsis.  Fever. EXAM: PORTABLE CHEST 1 VIEW COMPARISON:  Chest x-ray 04/27/2020. FINDINGS: Ill-defined opacity in the periphery of the left lung base obscuring the lateral left hemidiaphragm. Lung volumes are low. Mild diffuse interstitial prominence which appears to be chronic, similar to prior examinations. No right pleural effusions. No pneumothorax. No pulmonary nodule or mass noted. Pulmonary vasculature and the cardiomediastinal silhouette are within normal limits. Atherosclerotic calcifications in the thoracic aorta. IMPRESSION: 1. Low lung volumes with ill-defined opacity in the periphery of the left lung base which may reflect atelectasis and/or consolidation, with superimposed small left pleural effusion. 2. Aortic atherosclerosis. Electronically Signed   By: Trudie Reed M.D.   On: 01/10/2022 05:37    Pending Labs Unresulted Labs (From admission, onward)     Start     Ordered   01/17/22 0500  Creatinine, serum  (enoxaparin (LOVENOX)    CrCl >/= 30 ml/min)  Weekly,   STAT     Comments: while on enoxaparin therapy    01/10/22 0800   01/11/22 0500  Basic metabolic panel  Tomorrow morning,   STAT        01/10/22 0800  01/11/22 0500  CBC   Tomorrow morning,   STAT        01/10/22 0800   01/10/22 0830  Hemoglobin A1c  Once,   R        01/10/22 0830   01/10/22 0756  Expectorated Sputum Assessment w Gram Stain, Rflx to Resp Cult  (COPD / Pneumonia / Cellulitis / Lower Extremity Wound)  Once,   URGENT        01/10/22 0800   01/10/22 0756  Legionella Pneumophila Serogp 1 Ur Ag  (COPD / Pneumonia / Cellulitis / Lower Extremity Wound)  Once,   URGENT        01/10/22 0800   01/10/22 0756  Strep pneumoniae urinary antigen  (COPD / Pneumonia / Cellulitis / Lower Extremity Wound)  Once,   URGENT        01/10/22 0800   01/10/22 0743  Respiratory (~20 pathogens) panel by PCR  (Respiratory panel by PCR (~20 pathogens, ~24 hr TAT)  w precautions)  Once,   URGENT        01/10/22 0742   01/10/22 0512  Blood Culture (routine x 2)  (Septic presentation on arrival (screening labs, nursing and treatment orders for obvious sepsis))  BLOOD CULTURE X 2,   STAT      01/10/22 0512   01/10/22 0512  Urine Culture  (Septic presentation on arrival (screening labs, nursing and treatment orders for obvious sepsis))  ONCE - URGENT,   URGENT       Question:  Indication  Answer:  Sepsis   01/10/22 0512            Vitals/Pain Today's Vitals   01/10/22 1000 01/10/22 1030 01/10/22 1100 01/10/22 1118  BP: (!) 124/55 (!) 172/74 (!) 159/69 (!) 159/69  Pulse: 77 90 88 87  Resp: (!) 21 (!) 26 (!) 31 20  Temp:    98.9 F (37.2 C)  TempSrc:    Oral  SpO2: 99% 98% 100% 100%  Weight:        Isolation Precautions Droplet precaution  Medications Medications  lactated ringers infusion (0 mLs Intravenous Paused 01/10/22 1138)  cefTRIAXone (ROCEPHIN) 2 g in sodium chloride 0.9 % 100 mL IVPB (has no administration in time range)  insulin aspart (novoLOG) injection 0-5 Units (has no administration in time range)  insulin aspart (novoLOG) injection 0-9 Units ( Subcutaneous Not Given 01/10/22 1121)  enoxaparin (LOVENOX) injection 40 mg (40 mg Subcutaneous Given  01/10/22 1116)  acetaminophen (TYLENOL) tablet 650 mg (has no administration in time range)    Or  acetaminophen (TYLENOL) suppository 650 mg (has no administration in time range)  polyethylene glycol (MIRALAX / GLYCOLAX) packet 17 g (has no administration in time range)  ondansetron (ZOFRAN) tablet 4 mg (has no administration in time range)    Or  ondansetron (ZOFRAN) injection 4 mg (has no administration in time range)  azithromycin (ZITHROMAX) 500 mg in sodium chloride 0.9 % 250 mL IVPB (has no administration in time range)  atorvastatin (LIPITOR) tablet 20 mg (20 mg Oral Given 01/10/22 1111)  dorzolamide-timolol (COSOPT) 2-0.5 % ophthalmic solution 1 drop (1 drop Both Eyes Given 01/10/22 1115)  lactated ringers bolus 1,000 mL (0 mLs Intravenous Stopped 01/10/22 1109)    And  lactated ringers bolus 1,000 mL (0 mLs Intravenous Stopped 01/10/22 1109)  iohexol (OMNIPAQUE) 300 MG/ML solution 80 mL (80 mLs Intravenous Contrast Given 01/10/22 0656)  azithromycin (ZITHROMAX) 500 mg in sodium chloride 0.9 % 250 mL IVPB (0 mg  Intravenous Stopped 01/10/22 0933)    Mobility walks with device        R Recommendations: See Admitting Provider Note  Report given to:   Additional Notes:

## 2022-01-10 NOTE — ED Notes (Signed)
Pt is running a fever and is hot to the touch. Pt is mumbling and not answering questions. Pt has a leg monitor on right leg. Pt smells strongly of old urine.

## 2022-01-10 NOTE — Assessment & Plan Note (Signed)
TSH low with elevated free T4. Holding home Synthroid dose. Need repeat levels before restarting at a lower level which can be done by PCP.

## 2022-01-10 NOTE — ED Provider Notes (Addendum)
[facility] Provider Note    Event Date/Time   First MD Initiated Contact with Patient 01/10/22 [id number]     (approximate)   History   Weakness   HPI  Ashley Cantrell is a 88 y.o. female past medical history significant for dementia, diabetes, hypothyroidism, hyperlipidemia, hypertension, presents to the emergency department with altered mental status.  Initially called out to nursing facility for possible stroke however and found to have a fever and low blood pressure.  Foul-smelling urine.  Patient found to be hypotensive upon arrival to the emergency department.     Physical Exam   Triage Vital Signs: ED Triage Vitals  Enc Vitals Group     BP 01/10/22 0456 (!) 101/43     Pulse Rate 01/10/22 0456 96     Resp 01/10/22 0456 (!) 31     Temp --      Temp src --      SpO2 01/10/22 0456 95 %     Weight 01/10/22 0500 132 lb 12.8 oz (60.2 kg)     Height --      Head Circumference --      Peak Flow --      Pain Score --      Pain Loc --      Pain Edu? --      Excl. in GC? --     Most recent vital signs: Vitals:   01/10/22 0500 01/10/22 0530  BP:  (!) 118/39  Pulse:  94  Resp:  19  Temp: (!) 101.6 F (38.7 C)   SpO2:  97%    Physical Exam Constitutional:      General: She is in acute distress.     Appearance: She is well-developed. She is ill-appearing.  HENT:     Head: Atraumatic.  Eyes:     Conjunctiva/sclera: Conjunctivae normal.  Cardiovascular:     Rate and Rhythm: Tachycardia present.  Pulmonary:     Effort: No respiratory distress.  Abdominal:     General: There is no distension.     Tenderness: There is no abdominal tenderness.  Musculoskeletal:        General: Normal range of motion.     Cervical back: Normal range of motion.  Skin:    General: Skin is warm.     Capillary Refill: Capillary refill takes 2 to 3 seconds.  Neurological:     Mental Status: She is alert. Mental status is at baseline.          IMPRESSION  / MDM / ASSESSMENT AND PLAN / ED COURSE  I reviewed the triage vital signs and the nursing notes.  88 year old female presents to the emergency department with altered mental status from nursing facility.  Initially called out for possible stroke.  When EMS patient found to have a temperature and a soft blood pressure.  Differential diagnosis concerning for possible sepsis, dehydration, electrolyte abnormality.  Given shortness of breath and fever concern for possible COVID, obtained COVID/influenza PCR  Blood and urine cultures obtained.  Patient given 30 cc/kg of IV fluids.  Started on antibiotics to cover for urinary tract infection.  On chart review of outside records no recent hospitalist or antibiotic use.   EKG  I, Corena Herter, the attending physician, personally viewed and interpreted this ECG.   Rate: Normal  Rhythm: Normal sinus  Axis: Normal  Intervals: Normal  ST&T Change: None  No tachycardic or bradycardic dysrhythmias while on cardiac telemetry.  RADIOLOGY  I independently reviewed imaging, my interpretation of imaging: Chest x-ray no focal findings consistent with pneumonia  Chest x-ray read as low lung volumes ill-defined opacity of the peripheral of the left lung possible atelectasis or consolidation    ED Results / Procedures / Treatments   Labs (all labs ordered are listed, but only abnormal results are displayed) Labs interpreted as -  Low TSH we will add on a free T4.  No significant leukocytosis.  Creatinine elevated at 1.15 from her baseline of 0.9.  No significant electrolyte abnormalities.  Initial lactic acid 1.4.  Labs Reviewed  COMPREHENSIVE METABOLIC PANEL - Abnormal; Notable for the following components:      Result Value   Glucose, Bld 146 (*)    BUN 37 (*)    Creatinine, Ser 1.15 (*)    Calcium 8.4 (*)    Total Protein 6.1 (*)    Albumin 3.0 (*)    GFR, Estimated 44 (*)    All other components within normal limits  CBC WITH  DIFFERENTIAL/PLATELET - Abnormal; Notable for the following components:   RBC 3.42 (*)    Hemoglobin 10.5 (*)    HCT 31.8 (*)    All other components within normal limits  PROTIME-INR - Abnormal; Notable for the following components:   Prothrombin Time 16.2 (*)    INR 1.3 (*)    All other components within normal limits  URINALYSIS, COMPLETE (UACMP) WITH MICROSCOPIC - Abnormal; Notable for the following components:   Color, Urine YELLOW (*)    APPearance HAZY (*)    All other components within normal limits  TSH - Abnormal; Notable for the following components:   TSH 0.299 (*)    All other components within normal limits  T4, FREE - Abnormal; Notable for the following components:   Free T4 1.38 (*)    All other components within normal limits  RESP PANEL BY RT-PCR (FLU A&B, COVID) ARPGX2  CULTURE, BLOOD (ROUTINE X 2)  CULTURE, BLOOD (ROUTINE X 2)  URINE CULTURE  LACTIC ACID, PLASMA  APTT  LIPASE, BLOOD  LACTIC ACID, PLASMA  PROCALCITONIN    7:28 AM On sepsis reevaluation after IV fluids blood pressure improved.  Do not feel that further fluids are necessary at this time.  Improved perfusion.  CT scan without acute findings to explain the patient's infectious symptoms.  Free T4 mildly elevated.  Low suspicion for thyroid storm.  Consulted the hospitalist for admission for sepsis   PROCEDURES:  Critical Care performed: Yes  .Critical Care  Performed by: Nathaniel Man, MD Authorized by: Nathaniel Man, MD   Critical care provider statement:    Critical care time (minutes):  40   Critical care time was exclusive of:  Separately billable procedures and treating other patients   Critical care was necessary to treat or prevent imminent or life-threatening deterioration of the following conditions:  Sepsis   Critical care was time spent personally by me on the following activities:  Development of treatment plan with patient or surrogate, discussions with consultants,  evaluation of patient's response to treatment, examination of patient, ordering and review of laboratory studies, ordering and review of radiographic studies, ordering and performing treatments and interventions, pulse oximetry, re-evaluation of patient's condition and review of old charts   Patient's presentation is most consistent with acute presentation with potential threat to life or bodily function.   MEDICATIONS ORDERED IN ED: Medications  lactated ringers infusion (has no administration in time range)  cefTRIAXone (ROCEPHIN) 2 g  in sodium chloride 0.9 % 100 mL IVPB (0 g Intravenous Stopped 01/10/22 0630)  azithromycin (ZITHROMAX) 500 mg in sodium chloride 0.9 % 250 mL IVPB (has no administration in time range)  lactated ringers bolus 1,000 mL (1,000 mLs Intravenous New Bag/Given 01/10/22 0524)    And  lactated ringers bolus 1,000 mL (1,000 mLs Intravenous New Bag/Given 01/10/22 0557)  iohexol (OMNIPAQUE) 300 MG/ML solution 80 mL (80 mLs Intravenous Contrast Given 01/10/22 0656)    FINAL CLINICAL IMPRESSION(S) / ED DIAGNOSES   Final diagnoses:  Weakness  Fever, unspecified fever cause  Sepsis, due to unspecified organism, unspecified whether acute organ dysfunction present [facility])     Rx / DC Orders   ED Discharge Orders     None        Note:  This document was prepared using Dragon voice recognition software and may include unintentional dictation errors.   Nathaniel Man, MD 01/10/22 [id number]    Nathaniel Man, MD 01/10/22 [id number]

## 2022-01-10 NOTE — Progress Notes (Signed)
CODE SEPSIS - PHARMACY COMMUNICATION  **Broad Spectrum Antibiotics should be administered within 1 hour of Sepsis diagnosis**  Time Code Sepsis Called/Page Received: 5701  Antibiotics Ordered: Ceftriaxone  Time of 1st antibiotic administration: 0557  Otelia Sergeant, PharmD, [facility] 01/10/2022 5:14 AM

## 2022-01-10 NOTE — Assessment & Plan Note (Addendum)
Patient presented with soft blood pressure, most likely secondary to sepsis. Responded appropriately with IV fluid. Takes lisinopril at home. -Holding lisinopril for now

## 2022-01-10 NOTE — ED Notes (Signed)
Pt had large soft brown bm, cleaned pt and changed depends.  Family at bedside, pt awaits admission.

## 2022-01-10 NOTE — H&P (Addendum)
History and Physical    PatientMarland Kitchen Ashley Cantrell NGE:[id number] DOB: 11-Nov-1925 DOA: 01/10/2022 DOS: the patient was seen and examined on 01/10/2022 PCP: Medicine, [facility]  Patient coming from: SNF  Chief Complaint:  Chief Complaint  Patient presents with   Weakness   HPI: Ashley Cantrell is a 88 y.o. female with medical history significant of dementia, diabetes, hypothyroidism, hyperlipidemia, hypertension, presents to the emergency department with altered mental status.  Initially called out to nursing facility for possible stroke however and found to have a fever and low blood pressure.   Patient was very lethargic and not following any commands so unable to participate with any meaningful history.  Patient lives in a skilled nursing facility with history of advanced dementia.  Talked with niece who is her POA, she saw her few days ago and she was at baseline at that time.  At baseline patient sometimes recognizes family member and sometimes not but she was able to walk, talk and eat herself.  Niece did not noticed any change when she visited her few days ago.  ED course.  Patient was febrile at 101, heart rate 96, respiratory rate 31 and blood pressure Of 101/43.  Responded appropriately to IV fluid.  Labs with no leukocytosis, hemoglobin 10.5 which is at baseline, BUN 37, creatinine 1.15, baseline around 0.8, INR 1.3, UA negative, TSH low at 0.299, free T4 elevated at 1.38, COVID and influenza PCR negative, PCT negative.  Chest x-ray with an ill-defined opacity in the periphery of the left lung base which may reflect atelectasis versus consolidation with superimposed small left pleural effusion. CT abdomen and pelvis shows mild scarring in the right lower lobe, cholelithiasis without cholecystitis.  Colonic diverticulosis without diverticulitis.  Diffuse periportal edema in the liver which is nonspecific, and an unusual elongated low attenuation lesion in the liver which may simply  represent a cyst.  Please see the full report. CT head was negative for any acute abnormality. EKG with sinus rhythm and no significant abnormality.  Addendum.  Message received from nursing facility that they found her tracking her foot around 4 AM, at baseline she was able to walk with walker so they decided to send her to ED to rule out stroke.  MRI brain ordered    Review of Systems: unable to review all systems due to the inability of the patient to answer questions. Past Medical History:  Diagnosis Date   Anxiety    Diabetes mellitus without complication ([place])    Essential hypertension    Glaucoma    Hard of hearing    severe   Thyroid disease    Past Surgical History:  Procedure Laterality Date   CATARACT EXTRACTION     HIP PINNING,CANNULATED Left 10/28/2016   Procedure: CANNULATED HIP PINNING;  Surgeon: Lovell Sheehan, MD;  Location: [facility] ORS;  Service: Orthopedics;  Laterality: Left;   TOTAL HIP ARTHROPLASTY Right 04/27/2020   Procedure: TOTAL HIP ARTHROPLASTY ANTERIOR APPROACH;  Surgeon: Lovell Sheehan, MD;  Location: [facility] ORS;  Service: Orthopedics;  Laterality: Right;   Social History:  reports that she has never smoked. She has never used smokeless tobacco. She reports that she does not drink alcohol. No history on file for drug use.  No Known Allergies  Family History  Problem Relation Age of Onset   Breast cancer Neg Hx     Prior to Admission medications   Medication Sig Start Date End Date Taking? Authorizing Provider  acetaminophen (TYLENOL) 325 MG  tablet Take 2 tablets (650 mg total) by mouth every 6 (six) hours as needed. 10/30/16   Henreitta Leber, MD  atorvastatin (LIPITOR) 20 MG tablet Take 20 mg by mouth daily.    [provider]  dorzolamide-timolol (COSOPT) 22.3-6.8 MG/ML ophthalmic solution Place 1 drop into both eyes 2 (two) times daily.    [provider]  levothyroxine (SYNTHROID, LEVOTHROID) 75 MCG tablet Take 75 mcg by mouth daily  before breakfast.    [provider]  lisinopril (ZESTRIL) 5 MG tablet Take 1 tablet by mouth daily. 03/31/20 03/31/21  [provider]    Physical Exam: Vitals:   01/10/22 0459 01/10/22 0500 01/10/22 0530 01/10/22 0723  BP:   (!) 118/39 129/70  Pulse:   94 86  Resp:   19 18  Temp:  (!) 101.6 F (38.7 C)  99.2 F (37.3 C)  TempSrc:  Rectal  Oral  SpO2: 96%  97% 100%  Weight:  60.2 kg     Vitals:   01/10/22 0459 01/10/22 0500 01/10/22 0530 01/10/22 0723  BP:   (!) 118/39 129/70  Pulse:   94 86  Resp:   19 18  Temp:  (!) 101.6 F (38.7 C)  99.2 F (37.3 C)  TempSrc:  Rectal  Oral  SpO2: 96%  97% 100%  Weight:  60.2 kg     General: Vital signs reviewed.  Patient is fragile elderly lady, in no acute distress  Head: Normocephalic and atraumatic. Eyes: EOMI, conjunctivae normal, no scleral icterus.  Neck: Supple, trachea midline,  Cardiovascular: RRR, S1 normal, S2 normal, no murmurs, gallops, or rubs. Pulmonary/Chest: Clear to auscultation bilaterally, no wheezes, rales, or rhonchi. Abdominal: Soft, non-tender, non-distended, BS +, Extremities: No lower extremity edema bilaterally,  pulses symmetric and intact bilaterally. No cyanosis or clubbing. Neurological: Somnolent and lethargic, not following any commands unable to do any neurologic exam. Psychiatric: History of advanced dementia, not following any commands or participating with any meaningful history.  Data Reviewed: Prior data reviewed-as mentioned above  Assessment and Plan: * Acute metabolic encephalopathy Patient with altered mental status which was initially thought to be a stroke but CT head was negative.  No focal weakness. Ongoing work-up to rule out sepsis versus viral illness and dehydration. Also checking troponin -Admitted to medical telemetry -Continue with neurochecks  Sepsis [facility]) Patient met sepsis criteria with being febrile at 101, tachycardia and tachypnea, blood pressure was  soft which improved with IV fluid. UA is not consistent with UTI, COVID-19 and influenza PCR negative.  Chest x-ray concerning for pneumonia but CT more consistent with atelectasis. Might be some viral illness.  Urine and blood cultures drawn in ED and pending. Procalcitonin negative. -Check respiratory viral panel. -Check strep pneumo and Legionella antigen -Sputum culture -Follow-up blood and urine cultures. -We will continue with ceftriaxone and Zithromax at this time-can discontinue if viral panel is positive and no other source of infection found.  Hypothyroidism TSH low with elevated free T4. Holding home Synthroid dose. Need repeat levels before restarting at a lower level which can be done by PCP.  Essential hypertension Patient presented with soft blood pressure, most likely secondary to sepsis. Responded appropriately with IV fluid. Takes lisinopril at home. -Holding lisinopril for now  Type 2 diabetes, diet controlled ([place]) Patient has diet-controlled diabetes and not on any medications at home. -Monitor with CBG and sensitive SSI if needed  Dyslipidemia -Continue home Lipitor   Advance Care Planning:   Code Status: DNR confirmed  with niece who is her POA.  Consults: None  Family Communication: Talked with niece on phone  Severity of Illness: The appropriate patient status for this patient is INPATIENT. Inpatient status is judged to be reasonable and necessary in order to provide the required intensity of service to ensure the patient's safety. The patient's presenting symptoms, physical exam findings, and initial radiographic and laboratory data in the context of their chronic comorbidities is felt to place them at high risk for further clinical deterioration. Furthermore, it is not anticipated that the patient will be medically stable for discharge from the hospital within 2 midnights of admission.   * I certify that at the point of admission it is my clinical  judgment that the patient will require inpatient hospital care spanning beyond 2 midnights from the point of admission due to high intensity of service, high risk for further deterioration and high frequency of surveillance required.*  This record has been created using Systems analyst. Errors have been sought and corrected,but may not always be located. Such creation errors do not reflect on the standard of care.   Author: Lorella Nimrod, MD 01/10/2022 8:50 AM  For on call review [email or web address].

## 2022-01-11 DIAGNOSIS — G9341 Metabolic encephalopathy: Secondary | ICD-10-CM | POA: Diagnosis not present

## 2022-01-11 LAB — GLUCOSE, CAPILLARY
Glucose-Capillary: 79 mg/dL (ref 70–99)
Glucose-Capillary: 66 mg/dL — ABNORMAL LOW (ref 70–99)
Glucose-Capillary: 102 mg/dL — ABNORMAL HIGH (ref 70–99)
Glucose-Capillary: 102 mg/dL — ABNORMAL HIGH (ref 70–99)
Glucose-Capillary: 98 mg/dL (ref 70–99)

## 2022-01-11 LAB — BASIC METABOLIC PANEL
Anion gap: 5 (ref 5–15)
BUN: 26 mg/dL — ABNORMAL HIGH (ref 8–23)
CO2: 23 mmol/L (ref 22–32)
Calcium: 8.2 mg/dL — ABNORMAL LOW (ref 8.9–10.3)
Chloride: 115 mmol/L — ABNORMAL HIGH (ref 98–111)
Creatinine, Ser: 0.94 mg/dL (ref 0.44–1.00)
GFR, Estimated: 56 mL/min — ABNORMAL LOW (ref >60)
Glucose, Bld: 89 mg/dL (ref 70–99)
Potassium: 3.7 mmol/L (ref 3.5–5.1)
Sodium: 143 mmol/L (ref 135–145)

## 2022-01-11 LAB — CBC
HCT: 27.4 % — ABNORMAL LOW (ref 36.0–46.0)
Hemoglobin: 9 g/dL — ABNORMAL LOW (ref 12.0–15.0)
MCH: 30.6 pg (ref 26.0–34.0)
MCHC: 32.8 g/dL (ref 30.0–36.0)
MCV: 93.2 fL (ref 80.0–100.0)
Platelets: 262 10*3/uL (ref 150–400)
RBC: 2.94 MIL/uL — ABNORMAL LOW (ref 3.87–5.11)
RDW: 14.8 % (ref 11.5–15.5)
WBC: 4.2 10*3/uL (ref 4.0–10.5)
nRBC: 0 % (ref 0.0–0.2)

## 2022-01-11 LAB — URINE CULTURE: Culture: NO GROWTH

## 2022-01-11 LAB — HEMOGLOBIN A1C
Hgb A1c MFr Bld: 5.9 % — ABNORMAL HIGH (ref 4.8–5.6)
Mean Plasma Glucose: 123 mg/dL

## 2022-01-11 MED ORDER — HALOPERIDOL LACTATE 5 MG/ML IJ SOLN
2.0000 mg | Freq: Once | INTRAMUSCULAR | Status: AC
  Administered 2022-01-11: 2 mg via INTRAVENOUS
  Filled 2022-01-11: qty 1

## 2022-01-11 MED ORDER — LISINOPRIL 5 MG PO TABS
2.5000 mg | ORAL_TABLET | Freq: Every day | ORAL | Status: DC
  Administered 2022-01-11: 2.5 mg via ORAL
  Filled 2022-01-11: qty 1

## 2022-01-11 MED ORDER — LACTATED RINGERS IV SOLN: INTRAVENOUS | Status: AC

## 2022-01-11 NOTE — Progress Notes (Signed)
Progress Note   Patient: Ashley Cantrell NWG:[id number] DOB: 1925/11/02 DOA: 01/10/2022     1 DOS: the patient was seen and examined on 01/11/2022   Brief hospital course: Ashley Cantrell is a 88 y.o. female past medical history significant for dementia, diabetes, hypothyroidism, hyperlipidemia, hypertension, presents to the emergency department with altered mental status.  Initially called out to nursing facility for possible stroke however and found to have a fever and low blood pressure.   ED course.  Patient was febrile at 101, heart rate 96, respiratory rate 31 and blood pressure Of 101/43.  Responded appropriately to IV fluid.  Labs with no leukocytosis, hemoglobin 10.5 which is at baseline, BUN 37, creatinine 1.15, baseline around 0.8, INR 1.3, UA negative, TSH low at 0.299, free T4 elevated at 1.38, COVID and influenza PCR negative, PCT negative.  Chest x-ray with an ill-defined opacity in the periphery of the left lung base which may reflect atelectasis versus consolidation with superimposed small left pleural effusion. CT abdomen and pelvis shows mild scarring in the right lower lobe, cholelithiasis without cholecystitis.  Colonic diverticulosis without diverticulitis.  Diffuse periportal edema in the liver which is nonspecific, and an unusual elongated low attenuation lesion in the liver which may simply represent a cyst.  Please see the full report. CT head was negative for any acute abnormality. EKG with sinus rhythm and no significant abnormality.  Patient later developed watery diarrhea, C. difficile and GI pathogen panel ordered  11/20: Vitals stable, GI pathogen panel positive for norovirus, C. difficile negative.  Labs with hemoglobin 9, decreased from 10, no bleeding, most likely dilutional as all cell lines decreased.  Renal function improved with BUN of 26 and creatinine of 0.94 with IV fluid. Respiratory viral panel negative.  Mental status appears to be at baseline  now. Procalcitonin negative.  Discontinuing antibiotics.  No upper respiratory symptoms Giving some more IV fluid as she continued to have significant diarrhea. Should be able to go back to her facility tomorrow if remains stable  Assessment and Plan: * Acute metabolic encephalopathy Improved. Patient with altered mental status which was initially thought to be a stroke but CT head was negative.  No focal weakness. Most likely secondary to dehydration with diarrhea  Sepsis ([place]) Patient met sepsis criteria with being febrile at 101, tachycardia and tachypnea, blood pressure was soft which improved with IV fluid. UA is not consistent with UTI, COVID-19 and influenza PCR negative.  Chest x-ray concerning for pneumonia but CT more consistent with atelectasis. Might be some viral illness.  Urine and blood cultures drawn in ED and pending. Procalcitonin negative.  GI pathogen panel positive for norovirus. -Discontinue antibiotics -Supportive care for diarrhea-can use Imodium  Hypothyroidism TSH low with elevated free T4. Holding home Synthroid dose. Need repeat levels before restarting at a lower level which can be done by PCP.  Essential hypertension Blood pressure mildly elevated Patient presented with soft blood pressure, most likely secondary to sepsis. Responded appropriately with IV fluid. -Restart home lisinopril  Type 2 diabetes, diet controlled ([place]) Patient has diet-controlled diabetes and not on any medications at home. -Monitor with CBG and sensitive SSI if needed  Dyslipidemia -Continue home Lipitor    Subjective: Patient was seen and examined today.  Having some dysarthria which seems chronic.  Oriented to self only.  Denies any abdominal pain  Physical Exam: Vitals:   01/11/22 0336 01/11/22 0804 01/11/22 0946 01/11/22 1125  BP: (!) 122/56 (!) 131/56 124/60 (!) 131/50  Pulse: 78 71 63 60  Resp: 16 18 (!) 22 19  Temp: 99.1 F (37.3 C) 99 F (37.2 C) 99 F  (37.2 C) 98.5 F (36.9 C)  TempSrc:   Oral   SpO2: 99% 97% 95% 100%  Weight:       General.  Frail elderly lady in no acute distress. Pulmonary.  Lungs clear bilaterally, normal respiratory effort. CV.  Regular rate and rhythm, no JVD, rub or murmur. Abdomen.  Soft, nontender, nondistended, BS positive. CNS.  Alert and oriented to self only.  No focal neurologic deficit. Extremities.  No edema, no cyanosis, pulses intact and symmetrical. Psychiatry.  Judgment and insight appears impaired.  Data Reviewed: Prior data reviewed  Family Communication: Discussed with daughter at bedside  Disposition: Status is: Inpatient Remains inpatient appropriate because: Severity of illness  Planned Discharge Destination: Skilled nursing facility  DVT prophylaxis.  Lovenox Time spent: 45 minutes  This record has been created using Systems analyst. Errors have been sought and corrected,but may not always be located. Such creation errors do not reflect on the standard of care.   Author: Lorella Nimrod, MD 01/11/2022 4:24 PM  For on call review [email or web address].

## 2022-01-11 NOTE — Progress Notes (Signed)
Upon entering room pt had removed her rectal tube and ambulated to chair. Rectal tube remains out. Pt cleaned of stool. Assessment of rectum intact. Chair alarm set up and pt set up in chair with no complaints at this time.

## 2022-01-11 NOTE — Progress Notes (Signed)
       CROSS COVER NOTE  NAME: Ashley Cantrell MRN: [id number] DOB : 1926-01-07 ATTENDING PHYSICIAN: Arnetha Courser, MD    Date of Service   01/11/2022   HPI/Events of Note   Message received from RN reporting Ashley Cantrell is "yelling out" and attempting to get out of bed. On my assessment Ashley Cantrell is very hard of hearing and her loud speech is secondary to that. She is confused and believes she is at home.    Interventions   Assessment/Plan:  Delirium 2 mg haldol x1 PRN Haldol Melatonin Safety sitter Delirium precautions - Minimize/avoid deliriogenic meds including: anticholinergic, opiates, benzodiazepines           - Maintain hydration, oxygenation, nutrition           - Limit use of restraints and catheters           - Normalize sleep patterns by minimizing nighttime noise, light and interruptions by ensuring sleep apnea treatment is provided overnight if applicable, clustering care, opening blinds during the day           - Reorient the patient frequently, provide easily visible clock and calendar           - Provide sensory aids like glasses, hearing aids           - Encourage ambulation, regular activities and visitors to maintain cognitive stimulation              -Patient would benefit from having family members at bedside to reinforce his orientation     This document was prepared using Dragon voice recognition software and may include unintentional dictation errors.  Bishop Limbo DNP, MBA, FNP-BC Nurse Practitioner [facility] Pager [phone]

## 2022-01-11 NOTE — Progress Notes (Signed)
CBG of 66; 4oz juice provided. Recheck CBG resulted 102.  MD Notified. Continuing to monitor.

## 2022-01-12 DIAGNOSIS — G9341 Metabolic encephalopathy: Secondary | ICD-10-CM | POA: Diagnosis not present

## 2022-01-12 LAB — CBC
HCT: 29.3 % — ABNORMAL LOW (ref 36.0–46.0)
Hemoglobin: 9.7 g/dL — ABNORMAL LOW (ref 12.0–15.0)
MCH: 30.7 pg (ref 26.0–34.0)
MCHC: 33.1 g/dL (ref 30.0–36.0)
MCV: 92.7 fL (ref 80.0–100.0)
Platelets: 300 K/uL (ref 150–400)
RBC: 3.16 MIL/uL — ABNORMAL LOW (ref 3.87–5.11)
RDW: 14.4 % (ref 11.5–15.5)
WBC: 5.3 K/uL (ref 4.0–10.5)
nRBC: 0 % (ref 0.0–0.2)

## 2022-01-12 LAB — BASIC METABOLIC PANEL WITH GFR
Anion gap: 3 — ABNORMAL LOW (ref 5–15)
BUN: 21 mg/dL (ref 8–23)
CO2: 25 mmol/L (ref 22–32)
Calcium: 8.2 mg/dL — ABNORMAL LOW (ref 8.9–10.3)
Chloride: 115 mmol/L — ABNORMAL HIGH (ref 98–111)
Creatinine, Ser: 0.91 mg/dL (ref 0.44–1.00)
GFR, Estimated: 58 mL/min — ABNORMAL LOW (ref >60)
Glucose, Bld: 88 mg/dL (ref 70–99)
Potassium: 3.6 mmol/L (ref 3.5–5.1)
Sodium: 143 mmol/L (ref 135–145)

## 2022-01-12 LAB — LEGIONELLA PNEUMOPHILA SEROGP 1 UR AG: L. pneumophila Serogp 1 Ur Ag: NEGATIVE

## 2022-01-12 LAB — GLUCOSE, CAPILLARY
Glucose-Capillary: 70 mg/dL (ref 70–99)
Glucose-Capillary: 80 mg/dL (ref 70–99)
Glucose-Capillary: 92 mg/dL (ref 70–99)

## 2022-01-12 MED ORDER — LISINOPRIL 5 MG PO TABS: 5.0000 mg | ORAL_TABLET | Freq: Every day | ORAL | Status: DC

## 2022-01-12 MED ORDER — LEVOTHYROXINE SODIUM 75 MCG PO TABS: 75.0000 ug | ORAL_TABLET | Freq: Every day | ORAL | Status: DC

## 2022-01-12 MED ORDER — MELATONIN 5 MG PO TABS
5.0000 mg | ORAL_TABLET | Freq: Once | ORAL | Status: DC
  Filled 2022-01-12: qty 1

## 2022-01-12 MED ORDER — LISINOPRIL 5 MG PO TABS
5.0000 mg | ORAL_TABLET | Freq: Every day | ORAL | Status: DC
  Administered 2022-01-12: 5 mg via ORAL
  Filled 2022-01-12: qty 1

## 2022-01-12 MED ORDER — HALOPERIDOL LACTATE 5 MG/ML IJ SOLN: 5.0000 mg | Freq: Four times a day (QID) | INTRAMUSCULAR | Status: AC | PRN

## 2022-01-12 MED ORDER — FUROSEMIDE 20 MG PO TABS: 20.0000 mg | ORAL_TABLET | Freq: Every day | ORAL | Status: DC

## 2022-01-12 NOTE — Progress Notes (Signed)
Report called [phone]. Report provided to Trilby Leaver, LPN. All questions addressed at time of d/c.

## 2022-01-12 NOTE — Progress Notes (Signed)
Pt's personal aide visiting pt at the moment

## 2022-01-12 NOTE — Progress Notes (Signed)
   01/12/22 1239  Medical Necessity for Transport Certificate --- IF THIS TRANSPORT IS ROUND TRIP OR SCHEDULED AND REPEATED, A PHYSICIAN MUST COMPLETE THIS FORM  Transport from: Scientist, product/process development) [facility]  Transport to (Location) Other (Comment)  Did the patient arrive from a Skilled Nursing Facility, Assisted Living Facility or Group Home? Yes  Care Facility Name Other (enter name of facility below)  Care Facility Name [facility]  Is this the closest appropriate facility? Yes  Name of Transporting Agency [facility]  Round Trip Transport? No  Reason for Transport Discharge  Is this a hospice patient? No  Describe the Medical Condition acute metabolic encephalopathy, sepsis, hypothyroidism, diabetes  Q1 Are ALL the following "true"? 1. Patient unable to get up from bed without assistance  AND  2. Unable to ambulate  AND  3. Unable to sit in a chair, including wheelchair. Yes  Q2 Could the patient be transported safely by other means of transportation (I.E., wheelchair van)? No  Q3 Please check any of the following conditions that apply at the time of transport: Risk of injury to self and/or others;Altered mental status (sepsis, hypothyroidism, diabetes)  Electronic Signature Tempie Hoist  Credentials DP  Date Signed 01/12/22

## 2022-01-12 NOTE — Progress Notes (Signed)
Haldol ineffective. Patient continues to get out of bed frequently, unable to be redirected. Hospitalist messaged.

## 2022-01-12 NOTE — Progress Notes (Signed)
Pt alert to self only. Pt re-oriented by this RN to place, time, and situation. Pt frequently getting out of bed; pt is also restless and agitated, has been pulling at lines. Pt unable to be redirected by nursing staff. Hospitalist messaged and PRN Haldol given.

## 2022-01-12 NOTE — NC FL2 (Signed)
[place] MEDICAID FL2 LEVEL OF CARE SCREENING TOOL     IDENTIFICATION  Patient Name: GEORGEANN BRINKMAN Birthdate: 06-23-1925 Sex: female Admission Date (Current Location): 01/10/2022  [facility] and [place] Number:  Chiropodist and Address:  [facility], [address] [place], [place] [address]      Provider Number: [id number]  Attending Physician Name and Address:  Arnetha Courser, MD  Relative Name and Phone Number:  Leanne Chang- [phone]    Current Level of Care: Hospital Recommended Level of Care: Skilled Nursing Facility Prior Approval Number:    Date Approved/Denied:   PASRR Number: [id number]  Discharge Plan: SNF    Current Diagnoses: Patient Active Problem List   Diagnosis Date Noted   Acute metabolic encephalopathy 01/10/2022   Sepsis (HCC) 01/10/2022   Dyslipidemia 01/10/2022   Hearing impairment 04/27/2020   Closed displaced fracture of right femoral neck (HCC) 04/27/2020   History of fracture of left hip 04/27/2020   Osteoporosis 04/27/2020   Unwitnessed fall 04/27/2020   Preoperative clearance 04/27/2020   Closed right hip fracture (HCC) 04/27/2020   Hip fracture (HCC) 10/27/2016   Type 2 diabetes, diet controlled (HCC) 12/26/2014   Essential hypertension 07/31/2013   Hypothyroidism 07/31/2013    Orientation RESPIRATION BLADDER Height & Weight     Self  Normal External catheter Weight: 132 lb 12.8 oz (60.2 kg) Height:     BEHAVIORAL SYMPTOMS/MOOD NEUROLOGICAL BOWEL NUTRITION STATUS     (memory impairment, poor safety awareness) Continent (diarrhea)  (dysphagia 3)  AMBULATORY STATUS COMMUNICATION OF NEEDS Skin   Extensive Assist (walker) Verbally Other (Comment) (ecchymosis, MASD buttocks LDA)                       Personal Care Assistance Level of Assistance  Bathing, Feeding, Dressing Bathing Assistance: Maximum assistance Feeding assistance: Maximum assistance Dressing Assistance: Maximum  assistance     Functional Limitations Info  Speech, Hearing, Sight Sight Info: Adequate Hearing Info: Impaired Speech Info: Adequate    SPECIAL CARE FACTORS FREQUENCY  PT (By licensed PT), OT (By licensed OT)     PT Frequency: 5x a week OT Frequency: 5x a week            Contractures Contractures Info: Not present    Additional Factors Info  Code Status Code Status Info: DNR             Current Medications (01/12/2022):  This is the current hospital active medication list Current Facility-Administered Medications  Medication Dose Route Frequency Provider Last Rate Last Admin   acetaminophen (TYLENOL) tablet 650 mg  650 mg Oral Q6H PRN Arnetha Courser, MD       Or   acetaminophen (TYLENOL) suppository 650 mg  650 mg Rectal Q6H PRN Arnetha Courser, MD       atorvastatin (LIPITOR) tablet 20 mg  20 mg Oral Daily Arnetha Courser, MD   20 mg at 01/12/22 0951   dorzolamide-timolol (COSOPT) 2-0.5 % ophthalmic solution 1 drop  1 drop Both Eyes BID Arnetha Courser, MD   1 drop at 01/12/22 0951   enoxaparin (LOVENOX) injection 40 mg  40 mg Subcutaneous Q24H Arnetha Courser, MD   40 mg at 01/12/22 0951   insulin aspart (novoLOG) injection 0-5 Units  0-5 Units Subcutaneous QHS Amin, Tilman Neat, MD       insulin aspart (novoLOG) injection 0-9 Units  0-9 Units Subcutaneous TID WC Arnetha Courser, MD       lisinopril (  ZESTRIL) tablet 5 mg  5 mg Oral Daily Arnetha Courser, MD   5 mg at 01/12/22 0951   melatonin tablet 5 mg  5 mg Oral Once Foust, Katy L, NP       ondansetron (ZOFRAN) tablet 4 mg  4 mg Oral Q6H PRN Arnetha Courser, MD       Or   ondansetron (ZOFRAN) injection 4 mg  4 mg Intravenous Q6H PRN Arnetha Courser, MD       polyethylene glycol (MIRALAX / GLYCOLAX) packet 17 g  17 g Oral Daily PRN Arnetha Courser, MD         Discharge Medications: Please see discharge summary for a list of discharge medications.  Relevant Imaging Results:  Relevant Lab Results:   Additional  Information [id number]  Tempie Hoist, LCSWA

## 2022-01-12 NOTE — TOC Transition Note (Signed)
Transition of Care [facility]) - CM/SW Discharge Note   Patient Details  Name: Ashley Cantrell MRN: [id number] Date of Birth: March 25, 1925  [facility] CM/SW Contact:  Tempie Hoist, LCSWA Phone Number: 01/12/2022, 12:51 PM   Clinical Narrative:     TOC arranged transport to [facility] [phone]. RN Merry Proud calling report. [facility] will provide transport.   Final next level of care: Skilled Nursing Facility Barriers to Discharge: Barriers Resolved   Patient Goals and CMS Choice Patient states their goals for this hospitalization and ongoing recovery are:: return to SNF      Discharge Placement PASRR number recieved: 01/12/22 Existing PASRR number confirmed : 01/12/22          Patient chooses bed at: [facility] Patient to be transferred to facility by: [facility] [phone] Name of family member notified: Leanne Chang Patient and family notified of of transfer: 01/12/22  Discharge Plan and Services                 [facility] to [facility]                    Social Determinants of Health (SDOH) Interventions     Readmission Risk Interventions     No data to display

## 2022-01-12 NOTE — Discharge Summary (Signed)
Physician Discharge Summary   Patient: Ashley Cantrell MRN: [id number] DOB: 08/17/1925  Admit date:     01/10/2022  Discharge date: 01/12/22  Discharge Physician: Lorella Nimrod   PCP: Medicine, [facility]   Recommendations at discharge:  Please obtain CBC and BMP in 1 week Please obtain TSH and free T4 levels in 2 to 3 weeks-currently home Synthroid is on hold, it can be restarted if appropriate. We increased the dose of lisinopril to 5 mg daily. Keep holding Lasix until diarrhea completely resolved.  Discharge Diagnoses: Principal Problem:   Acute metabolic encephalopathy Active Problems:   Sepsis ([place])   Hypothyroidism   Essential hypertension   Type 2 diabetes, diet controlled ([place])   Dyslipidemia   Hospital Course: Ashley Cantrell is a 88 y.o. female past medical history significant for dementia, diabetes, hypothyroidism, hyperlipidemia, hypertension, presents to the emergency department with altered mental status.  Initially called out to nursing facility for possible stroke however and found to have a fever and low blood pressure.   ED course.  Patient was febrile at 101, heart rate 96, respiratory rate 31 and blood pressure Of 101/43.  Responded appropriately to IV fluid.  Labs with no leukocytosis, hemoglobin 10.5 which is at baseline, BUN 37, creatinine 1.15, baseline around 0.8, INR 1.3, UA negative, TSH low at 0.299, free T4 elevated at 1.38, COVID and influenza PCR negative, PCT negative.  Chest x-ray with an ill-defined opacity in the periphery of the left lung base which may reflect atelectasis versus consolidation with superimposed small left pleural effusion. CT abdomen and pelvis shows mild scarring in the right lower lobe, cholelithiasis without cholecystitis.  Colonic diverticulosis without diverticulitis.  Diffuse periportal edema in the liver which is nonspecific, and an unusual elongated low attenuation lesion in the liver which may simply represent a cyst.   Please see the full report. CT head was negative for any acute abnormality. EKG with sinus rhythm and no significant abnormality.  Patient later developed watery diarrhea, C. difficile and GI pathogen panel ordered  11/20: Vitals stable, GI pathogen panel positive for norovirus, C. difficile negative.  Labs with hemoglobin 9, decreased from 10, no bleeding, most likely dilutional as all cell lines decreased.  Renal function improved with BUN of 26 and creatinine of 0.94 with IV fluid. Respiratory viral panel negative.  Mental status appears to be at baseline now. Procalcitonin negative.  Discontinuing antibiotics.  No upper respiratory symptoms Giving some more IV fluid as she continued to have significant diarrhea. Should be able to go back to her facility tomorrow if remains stable.  11/21: Blood pressure mildly elevated, increasing the home dose of lisinopril from 2.5 to 5 mg daily.  Keep holding home Lasix of 20 mg daily until diarrhea completely resolves. Keep holding home Synthroid and recheck TSH and free T4, can restart at a lower dose when appropriate.  Labs remained stable.  Diarrhea much improved but if needed she can use Imodium.  Please encourage p.o. hydration.  Patient is being discharged on current medications except with the changes as mentioned above and need to have a follow-up with her providers for further recommendations.  Assessment and Plan: * Acute metabolic encephalopathy Improved. Patient with altered mental status which was initially thought to be a stroke but CT head was negative.  No focal weakness. Most likely secondary to dehydration with diarrhea  Sepsis ([place]) Patient met sepsis criteria with being febrile at 101, tachycardia and tachypnea, blood pressure was soft which improved with  IV fluid. UA is not consistent with UTI, COVID-19 and influenza PCR negative.  Chest x-ray concerning for pneumonia but CT more consistent with atelectasis. Might be some viral  illness.  Urine and blood cultures drawn in ED and pending. Procalcitonin negative.  GI pathogen panel positive for norovirus. -Discontinue antibiotics -Supportive care for diarrhea-can use Imodium  Hypothyroidism TSH low with elevated free T4. Holding home Synthroid dose. Need repeat levels before restarting at a lower level which can be done by PCP.  Essential hypertension Blood pressure mildly elevated Patient presented with soft blood pressure, most likely secondary to sepsis. Responded appropriately with IV fluid. -Restart home lisinopril  Type 2 diabetes, diet controlled ([place]) Patient has diet-controlled diabetes and not on any medications at home. -Monitor with CBG and sensitive SSI if needed  Dyslipidemia -Continue home Lipitor   Consultants: None Procedures performed: None Disposition: Long term care facility Diet recommendation:  Discharge Diet Orders (From admission, onward)     Start     Ordered   01/12/22 0000  Diet - low sodium heart healthy        01/12/22 1102           Cardiac and Carb modified diet DISCHARGE MEDICATION: Allergies as of 01/12/2022   No Known Allergies      Medication List     STOP taking these medications    ALPRAZolam 0.25 MG tablet Commonly known as: XANAX   amLODipine 5 MG tablet Commonly known as: NORVASC   atorvastatin 20 MG tablet Commonly known as: LIPITOR   traZODone 50 MG tablet Commonly known as: DESYREL       TAKE these medications    acetaminophen 325 MG tablet Commonly known as: Tylenol Take 2 tablets (650 mg total) by mouth every 6 (six) hours as needed.   aspirin 81 MG chewable tablet Chew 81 mg by mouth daily.   D3 2000 50 MCG (2000 UT) Caps Generic drug: Cholecalciferol Take 1 capsule by mouth daily.   dorzolamide-timolol 2-0.5 % ophthalmic solution Commonly known as: COSOPT Place 1 drop into both eyes 2 (two) times daily.   furosemide 20 MG tablet Commonly known as: LASIX Take 1  tablet (20 mg total) by mouth daily. Please hold until diarrhea completely resolved What changed:  how much to take when to take this additional instructions   latanoprost 0.005 % ophthalmic solution Commonly known as: XALATAN SMARTSIG:In Eye(s)   levocetirizine 5 MG tablet Commonly known as: XYZAL   levothyroxine 75 MCG tablet Commonly known as: SYNTHROID Take 1 tablet (75 mcg total) by mouth daily before breakfast. Please hold until you recheck TSH and then restart at a lower dose What changed: additional instructions   lisinopril 5 MG tablet Commonly known as: ZESTRIL Take 1 tablet (5 mg total) by mouth daily. What changed: how much to take   LORazepam 0.5 MG tablet Commonly known as: ATIVAN Take by mouth.   melatonin 5 MG Tabs Take 5 mg by mouth at bedtime.   mirtazapine 7.5 MG tablet Commonly known as: REMERON TAKE 1/2 TABLET BY MOUTH NIGHTLY   risperiDONE 1 MG tablet Commonly known as: RISPERDAL Take 1 mg by mouth at bedtime. What changed: Another medication with the same name was removed. Continue taking this medication, and follow the directions you see here.   sennosides-docusate sodium 8.6-50 MG tablet Commonly known as: SENOKOT-S Take 1 tablet by mouth daily.   Travatan Z 0.004 % Soln ophthalmic solution Generic drug: Travoprost (BAK Free) INT 1 GTT  IN OU HS               Discharge Care Instructions  (From admission, onward)           Start     Ordered   01/12/22 0000  No dressing needed        01/12/22 1102            Follow-up Information     Medicine, [facility]. Schedule an appointment as soon as possible for a visit in 1 week(s).   Contact information: [place] [id number] [phone]                Discharge Exam: Danley Danker Weights   01/10/22 0500  Weight: 60.2 kg   General.  Frail, hard of hearing elderly lady, in no acute distress. Pulmonary.  Lungs clear bilaterally, normal  respiratory effort. CV.  Regular rate and rhythm, no JVD, rub or murmur. Abdomen.  Soft, nontender, nondistended, BS positive. CNS.  Alert and oriented .  No focal neurologic deficit. Extremities.  No edema, no cyanosis, pulses intact and symmetrical. Psychiatry.  Appears to have some cognitive impairment,  Condition at discharge: stable  The results of significant diagnostics from this hospitalization (including imaging, microbiology, ancillary and laboratory) are listed below for reference.   Imaging Studies: MR BRAIN WO CONTRAST  Result Date: 01/10/2022 CLINICAL DATA:  Mental status change, persistent or worsening. EXAM: MRI HEAD WITHOUT CONTRAST TECHNIQUE: Multiplanar, multiecho pulse sequences of the brain and surrounding structures were obtained without intravenous contrast. COMPARISON:  Head CT from earlier today FINDINGS: Brain: No acute infarction, hemorrhage, hydrocephalus, extra-axial collection or mass lesion. On axial T2 weighted imaging apparent T2 hyperintensity in the upper and ventral medulla is likely from volume averaging at the level of the pontine medullary junction based on the thinnest T1 weighted images and coronal sequences. Chronic small vessel ischemia to a moderate degree in the periventricular white matter. Small remote cerebellar infarcts. Age normal brain volume. Vascular: Normal flow voids. Skull and upper cervical spine: Normal marrow signal. Sinuses/Orbits: Left cataract resection.  Clear sinuses. IMPRESSION: 1. No acute infarct or other specific cause for symptoms. 2. Chronic small vessel ischemia and small remote cerebellar infarcts. Electronically Signed   By: Jorje Guild M.D.   On: 01/10/2022 12:02   CT Abdomen Pelvis W Contrast  Result Date: 01/10/2022 CLINICAL DATA:  88 year old female with history of acute onset of nonlocalized abdominal pain. EXAM: CT ABDOMEN AND PELVIS WITH CONTRAST TECHNIQUE: Multidetector CT imaging of the abdomen and pelvis was  performed using the standard protocol following bolus administration of intravenous contrast. RADIATION DOSE REDUCTION: This exam was performed according to the departmental dose-optimization program which includes automated exposure control, adjustment of the mA and/or kV according to patient size and/or use of iterative reconstruction technique. CONTRAST:  47m OMNIPAQUE IOHEXOL 300 MG/ML  SOLN COMPARISON:  No priors. FINDINGS: Lower chest: Mild scarring in the left lower lobe. Atherosclerotic calcifications in the thoracic aorta. Mild calcification of the aortic valve. Atherosclerotic calcifications also noted in the left main, left anterior descending, left circumflex and right coronary arteries. Small hiatal hernia. Hepatobiliary: Mild diffuse periportal edema in the liver. In segment 2 of the liver (axial images 18 and 19 of series 2) there is an elongated low-attenuation lesion estimated to measure approximately 4.3 x 1.0 cm, which may simply represent an ovoid shaped cysts, however, the possibility of focal intrahepatic biliary ductal dilatation in this region is not entirely excluded.  No other intra or extrahepatic biliary ductal dilatation is noted. Multiple large calcified gallstones in the fundus of the gallbladder, measuring up to 3 cm in diameter. Gallbladder is moderately distended. No gallbladder wall thickening or pericholecystic fluid or surrounding inflammatory changes to indicate an acute cholecystitis at this time. Pancreas: No pancreatic mass. No pancreatic ductal dilatation. No pancreatic or peripancreatic fluid collections or inflammatory changes. Spleen: Unremarkable. Adrenals/Urinary Tract: Bilateral kidneys and bilateral adrenal glands are normal in appearance. No hydroureteronephrosis. Urinary bladder is partially obscured by extensive beam hardening artifact from bilateral pelvic hardware, but visualized portions are unremarkable. Stomach/Bowel: Stomach is nearly completely decompressed,  but otherwise unremarkable in appearance. No pathologic dilatation of small bowel or colon. Numerous colonic diverticuli are noted, particularly in the sigmoid colon, without definite focal surrounding inflammatory changes to indicate an acute diverticulitis at this time. Normal appendix. Vascular/Lymphatic: Aortic atherosclerosis, without evidence of aneurysm or dissection in the abdominal or pelvic vasculature. No lymphadenopathy noted in the abdomen or pelvis. Reproductive: Status post hysterectomy. Ovaries are not confidently identified may be surgically absent or atrophic. Other: No significant volume of ascites.  No pneumoperitoneum. Musculoskeletal: Status post right hip arthroplasty. Status post ORIF in the left femoral neck where there are 3 cannulated fixation screws. There are no aggressive appearing lytic or blastic lesions noted in the visualized portions of the skeleton. IMPRESSION: 1. Cholelithiasis without definitive evidence of acute cholecystitis at this time. 2. Diffuse periportal edema in the liver. This is a nonspecific finding, but correlation with liver function tests is recommended. 3. Unusual elongated low-attenuation lesion in segment 2 of the liver. This may simply represent a cyst or adjacent small cysts, however, the possibility of peripheral intrahepatic biliary ductal dilatation is not excluded. If there is clinical concern for biliary obstruction, further evaluation with abdominal MRI with and without IV gadolinium with MRCP could be considered to exclude the possibility of centrally obstructing neoplasm (none definitively visualized by CT imaging). 4. Aortic atherosclerosis, in addition to left main and three-vessel coronary artery disease. 5. Colonic diverticulosis without evidence of acute diverticulitis at this time. 6. Additional incidental findings, as above. Electronically Signed   By: Vinnie Langton M.D.   On: 01/10/2022 07:05   CT Head Wo Contrast  Result Date:  01/10/2022 CLINICAL DATA:  Mental status change EXAM: CT HEAD WITHOUT CONTRAST TECHNIQUE: Contiguous axial images were obtained from the base of the skull through the vertex without intravenous contrast. RADIATION DOSE REDUCTION: This exam was performed according to the departmental dose-optimization program which includes automated exposure control, adjustment of the mA and/or kV according to patient size and/or use of iterative reconstruction technique. COMPARISON:  None Available. FINDINGS: Brain: No evidence of acute infarction, hemorrhage, hydrocephalus, extra-axial collection or mass lesion/mass effect. Scattered areas of periventricular and subcortical white matter hypoattenuation are noted. One hundred specific, findings are most consistent with chronic microvascular ischemic white matter disease. Vascular: No hyperdense vessel or unexpected calcification. Skull: Negative for fracture or focal lesion. Hyperostosis frontalis interna. Sinuses/Orbits: No acute finding. Other: None. IMPRESSION: 1. No acute intracranial abnormality. 2. Chronic microvascular ischemic white matter changes. Electronically Signed   By: Jacqulynn Cadet M.D.   On: 01/10/2022 07:00   DG Chest Port 1 View  Result Date: 01/10/2022 CLINICAL DATA:  88 year old female with possible sepsis.  Fever. EXAM: PORTABLE CHEST 1 VIEW COMPARISON:  Chest x-ray 04/27/2020. FINDINGS: Ill-defined opacity in the periphery of the left lung base obscuring the lateral left hemidiaphragm. Lung volumes are low. Mild diffuse  interstitial prominence which appears to be chronic, similar to prior examinations. No right pleural effusions. No pneumothorax. No pulmonary nodule or mass noted. Pulmonary vasculature and the cardiomediastinal silhouette are within normal limits. Atherosclerotic calcifications in the thoracic aorta. IMPRESSION: 1. Low lung volumes with ill-defined opacity in the periphery of the left lung base which may reflect atelectasis and/or  consolidation, with superimposed small left pleural effusion. 2. Aortic atherosclerosis. Electronically Signed   By: Vinnie Langton M.D.   On: 01/10/2022 05:37    Microbiology: Results for orders placed or performed during the hospital encounter of 01/10/22  Blood Culture (routine x 2)     Status: None (Preliminary result)   Collection Time: 01/10/22  5:05 AM   Specimen: BLOOD  Result Value Ref Range Status   Specimen Description BLOOD RIGHT [facility]  Final   Special Requests   Final    BOTTLES DRAWN AEROBIC AND ANAEROBIC Blood Culture adequate volume   Culture   Final    NO GROWTH 2 DAYS Performed at [facility], [address] [place], [place] [address]    Report Status PENDING  Incomplete  Urine Culture     Status: None   Collection Time: 01/10/22  5:05 AM   Specimen: In/Out Cath Urine  Result Value Ref Range Status   Specimen Description   Final    IN/OUT CATH URINE Performed at [facility], [address], [place], [place] [address]    Special Requests   Final    NONE Performed at [facility], [address] [place], [place] [address]    Culture   Final    NO GROWTH Performed at [facility], [place] [address], [place], [place] [address]    Report Status 01/11/2022 FINAL  Final  Resp Panel by RT-PCR (Flu A&B, Covid) Anterior Nasal Swab     Status: None   Collection Time: 01/10/22  6:03 AM   Specimen: Anterior Nasal Swab  Result Value Ref Range Status   SARS Coronavirus 2 by RT PCR NEGATIVE NEGATIVE Final    Comment: (NOTE) SARS-CoV-2 target nucleic acids are NOT DETECTED.  The SARS-CoV-2 RNA is generally detectable in upper respiratory specimens during the acute phase of infection. The lowest concentration of SARS-CoV-2 viral copies this assay can detect is 138 copies/mL. A negative result does not preclude SARS-Cov-2 infection and should not be used as the sole basis for treatment or other patient management decisions. A negative  result may occur with  improper specimen collection/handling, submission of specimen other than nasopharyngeal swab, presence of viral mutation(s) within the areas targeted by this assay, and inadequate number of viral copies(<138 copies/mL). A negative result must be combined with clinical observations, patient history, and epidemiological information. The expected result is Negative.  Fact Sheet for Patients:  EntrepreneurPulse.com.au  Fact Sheet for Healthcare Providers:  IncredibleEmployment.be  This test is no t yet approved or cleared by the [place] FDA and  has been authorized for detection and/or diagnosis of SARS-CoV-2 by FDA under an Emergency Use Authorization (EUA). This EUA will remain  in effect (meaning this test can be used) for the duration of the COVID-19 declaration under Section 564(b)(1) of the Act, 21 U.S.C.section 360bbb-3(b)(1), unless the authorization is terminated  or revoked sooner.       Influenza A by PCR NEGATIVE NEGATIVE Final   Influenza B by PCR NEGATIVE NEGATIVE Final    Comment: (NOTE) The Xpert Xpress SARS-CoV-2/FLU/RSV plus assay is intended as an  aid in the diagnosis of influenza from Nasopharyngeal swab specimens and should not be used as a sole basis for treatment. Nasal washings and aspirates are unacceptable for Xpert Xpress SARS-CoV-2/FLU/RSV testing.  Fact Sheet for Patients: EntrepreneurPulse.com.au  Fact Sheet for Healthcare Providers: IncredibleEmployment.be  This test is not yet approved or cleared by the [place] FDA and has been authorized for detection and/or diagnosis of SARS-CoV-2 by FDA under an Emergency Use Authorization (EUA). This EUA will remain in effect (meaning this test can be used) for the duration of the COVID-19 declaration under Section 564(b)(1) of the Act, 21 U.S.C. section 360bbb-3(b)(1), unless the authorization is  terminated or revoked.  Performed at [facility], [address] [place], [place] [address]   Blood Culture (routine x 2)     Status: None (Preliminary result)   Collection Time: 01/10/22  6:03 AM   Specimen: BLOOD  Result Value Ref Range Status   Specimen Description BLOOD BLOOD RIGHT HAND  Final   Special Requests   Final    BOTTLES DRAWN AEROBIC AND ANAEROBIC Blood Culture results may not be optimal due to an inadequate volume of blood received in culture bottles   Culture   Final    NO GROWTH 2 DAYS Performed at [facility], [address] [place], [place] [address]    Report Status PENDING  Incomplete  Respiratory (~20 pathogens) panel by PCR     Status: None   Collection Time: 01/10/22  9:40 AM   Specimen: Nasopharyngeal Swab; Respiratory  Result Value Ref Range Status   Adenovirus NOT DETECTED NOT DETECTED Final   Coronavirus 229E NOT DETECTED NOT DETECTED Final    Comment: (NOTE) The Coronavirus on the Respiratory Panel, DOES NOT test for the novel  Coronavirus (2019 nCoV)    Coronavirus HKU1 NOT DETECTED NOT DETECTED Final   Coronavirus NL63 NOT DETECTED NOT DETECTED Final   Coronavirus OC43 NOT DETECTED NOT DETECTED Final   Metapneumovirus NOT DETECTED NOT DETECTED Final   Rhinovirus / Enterovirus NOT DETECTED NOT DETECTED Final   Influenza A NOT DETECTED NOT DETECTED Final   Influenza B NOT DETECTED NOT DETECTED Final   Parainfluenza Virus 1 NOT DETECTED NOT DETECTED Final   Parainfluenza Virus 2 NOT DETECTED NOT DETECTED Final   Parainfluenza Virus 3 NOT DETECTED NOT DETECTED Final   Parainfluenza Virus 4 NOT DETECTED NOT DETECTED Final   Respiratory Syncytial Virus NOT DETECTED NOT DETECTED Final   Bordetella pertussis NOT DETECTED NOT DETECTED Final   Bordetella Parapertussis NOT DETECTED NOT DETECTED Final   Chlamydophila pneumoniae NOT DETECTED NOT DETECTED Final   Mycoplasma pneumoniae NOT DETECTED NOT DETECTED Final    Comment:  Performed at [facility], [place] [address], [place], [place] [address]  C Difficile Quick Screen w PCR reflex     Status: None   Collection Time: 01/10/22  1:56 PM   Specimen: STOOL  Result Value Ref Range Status   C Diff antigen NEGATIVE NEGATIVE Final   C Diff toxin NEGATIVE NEGATIVE Final   C Diff interpretation No C. difficile detected.  Final    Comment: Performed at [facility], [address] [place], [place] [address]  Gastrointestinal Panel by PCR , Stool     Status: Abnormal   Collection Time: 01/10/22  1:56 PM   Specimen: Stool  Result Value Ref Range Status   Campylobacter species NOT DETECTED NOT DETECTED Final   Plesimonas shigelloides NOT DETECTED NOT DETECTED Final  Salmonella species NOT DETECTED NOT DETECTED Final   Yersinia enterocolitica NOT DETECTED NOT DETECTED Final   Vibrio species NOT DETECTED NOT DETECTED Final   Vibrio cholerae NOT DETECTED NOT DETECTED Final   Enteroaggregative E coli (EAEC) NOT DETECTED NOT DETECTED Final   Enteropathogenic E coli (EPEC) NOT DETECTED NOT DETECTED Final   Enterotoxigenic E coli (ETEC) NOT DETECTED NOT DETECTED Final   Shiga like toxin producing E coli (STEC) NOT DETECTED NOT DETECTED Final   Shigella/Enteroinvasive E coli (EIEC) NOT DETECTED NOT DETECTED Final   Cryptosporidium NOT DETECTED NOT DETECTED Final   Cyclospora cayetanensis NOT DETECTED NOT DETECTED Final   Entamoeba histolytica NOT DETECTED NOT DETECTED Final   Giardia lamblia NOT DETECTED NOT DETECTED Final   Adenovirus F40/41 NOT DETECTED NOT DETECTED Final   Astrovirus NOT DETECTED NOT DETECTED Final   Norovirus GI/GII DETECTED (A) NOT DETECTED Final    Comment: RESULT CALLED TO, READ BACK BY AND VERIFIED WITH: BRANDY MILLER 01/10/22 1549 AMK    Rotavirus A NOT DETECTED NOT DETECTED Final   Sapovirus (I, II, IV, and V) NOT DETECTED NOT DETECTED Final    Comment: Performed at [facility], [address] [place], [place]  [address]    Labs: CBC: Recent Labs  Lab 01/10/22 0505 01/11/22 0523 01/12/22 0520  WBC 5.2 4.2 5.3  NEUTROABS 3.6  --   --   HGB 10.5* 9.0* 9.7*  HCT 31.8* 27.4* 29.3*  MCV 93.0 93.2 92.7  PLT 336 262 315   Basic Metabolic Panel: Recent Labs  Lab 01/10/22 0505 01/10/22 0940 01/10/22 1651 01/11/22 0523 01/12/22 0520  NA 139  --  140 143 143  K 4.1  --  3.7 3.7 3.6  CL 108  --  111 115* 115*  CO2 22  --  20* 23 25  GLUCOSE 146*  --  78 89 88  BUN 37*  --  27* 26* 21  CREATININE 1.15*  --  0.87 0.94 0.91  CALCIUM 8.4*  --  8.2* 8.2* 8.2*  MG  --  1.9 1.9  --   --   PHOS  --  3.1  --   --   --    Liver Function Tests: Recent Labs  Lab 01/10/22 0505  AST 19  ALT 11  ALKPHOS 84  BILITOT 0.8  PROT 6.1*  ALBUMIN 3.0*   CBG: Recent Labs  Lab 01/11/22 1157 01/11/22 1642 01/11/22 2014 01/12/22 0843 01/12/22 0953  GLUCAP 102* 102* 98 70 80    Discharge time spent: greater than 30 minutes.  This record has been created using Systems analyst. Errors have been sought and corrected,but may not always be located. Such creation errors do not reflect on the standard of care.   Signed: Lorella Nimrod, MD [facility] 01/12/2022

## 2022-01-12 NOTE — TOC Progression Note (Signed)
Transition of Care [facility]) - Progression Note    Patient Details  Name: Ashley Cantrell MRN: [id number] Date of Birth: Jul 03, 1925  [facility] CM/SW Contact  Tempie Hoist, [place] Phone Number: 01/12/2022, 11:22 AM  Clinical Narrative:      TOC spoke to the patient's niece Dedra Skeens and this patient lives at [facility]. TOC is sending a referral to this SNF.      Expected Discharge Plan and Services    SNF       Expected Discharge Date: 01/12/22                                     Social Determinants of Health (SDOH) Interventions    Readmission Risk Interventions     No data to display

## 2022-01-15 LAB — CULTURE, BLOOD (ROUTINE X 2)
Culture: NO GROWTH
Culture: NO GROWTH
Special Requests: ADEQUATE

## 2022-02-07 IMAGING — CT CT HEAD W/O CM
4 of 7 series · 16 of 47 positions shown, 17 images · non-contrast
Comparison: None

CLINICAL DATA: Mechanical fall last night and this morning,
transferring from bed to wheelchair, struck back of head, posterior
hematoma, baseline disorientation

EXAM:
CT HEAD WITHOUT CONTRAST
CT CERVICAL SPINE WITHOUT CONTRAST
TECHNIQUE: Multidetector CT imaging of the head and cervical spine was
performed following the standard protocol without intravenous
contrast. Multiplanar CT image reconstructions of the cervical spine
were also generated.

[Series 2: head wo · axial · 0.39mm/px · z∈[+321,+466]mm · 3 of 30 slices shown, 4 images]
[im 1/30  brain]
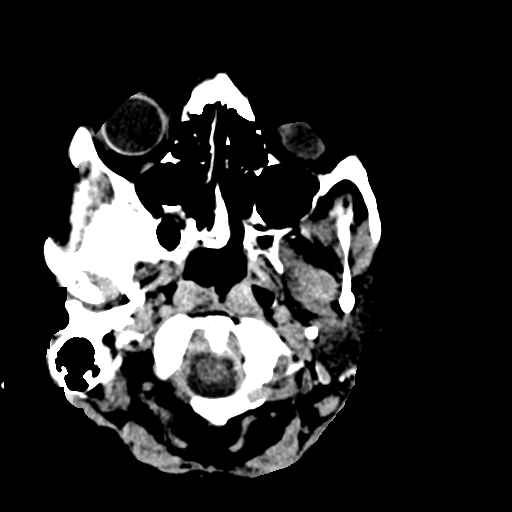
[im 1/30  bone]
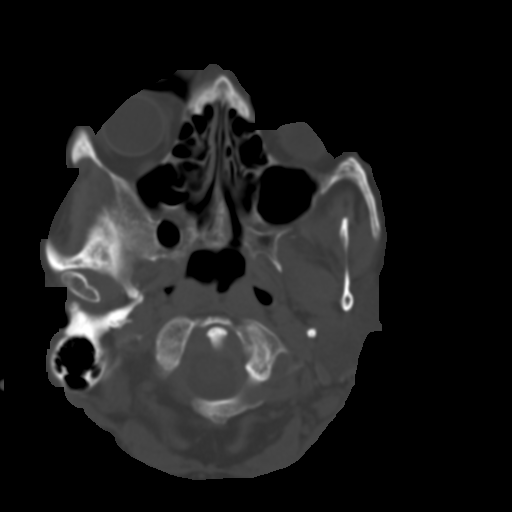
[im 15/30  brain]
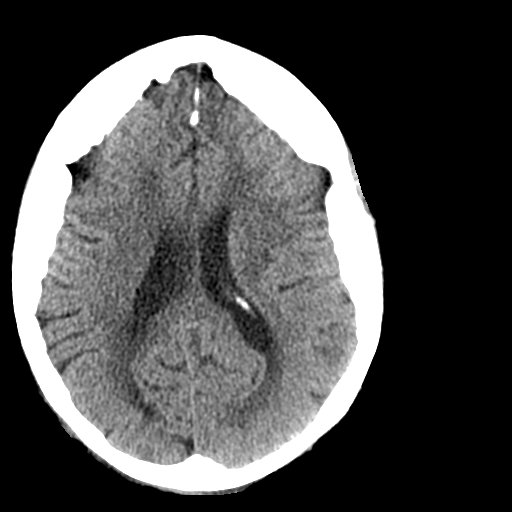
[im 30/30  brain]
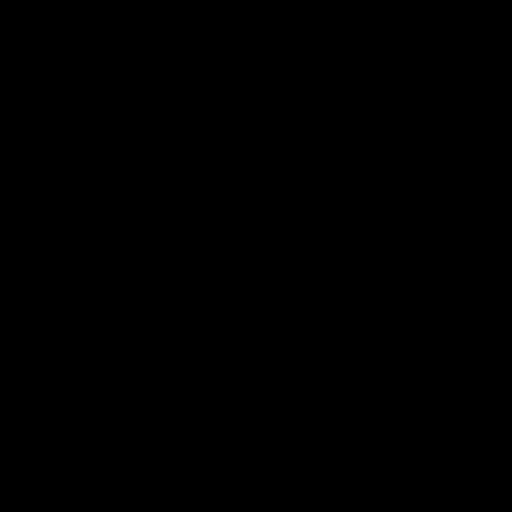

[Series 4: coronal soft tissue · coronal · 0.29mm/px · 3 of 79 slices shown]
[im 27/79  brain]
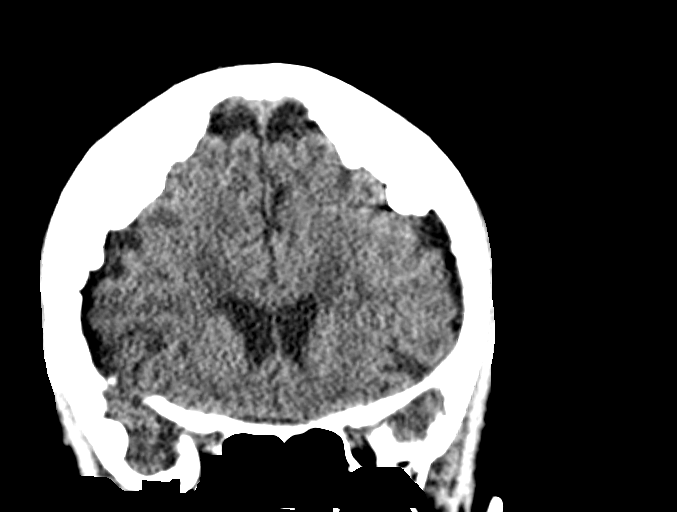
[im 35/79  brain]
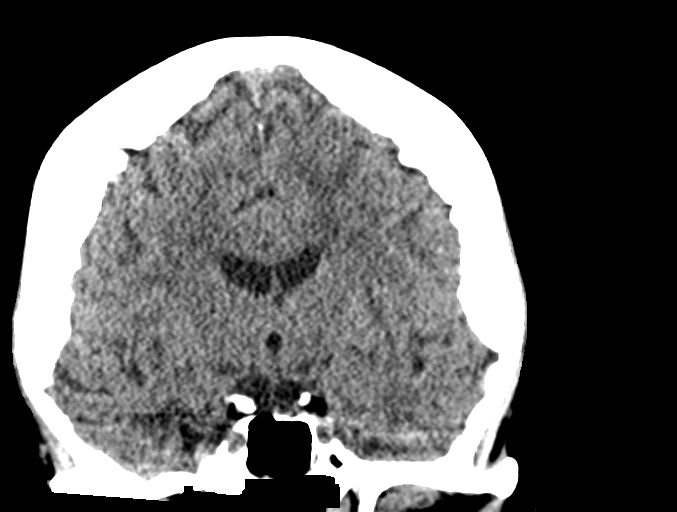
[im 44/79  brain]
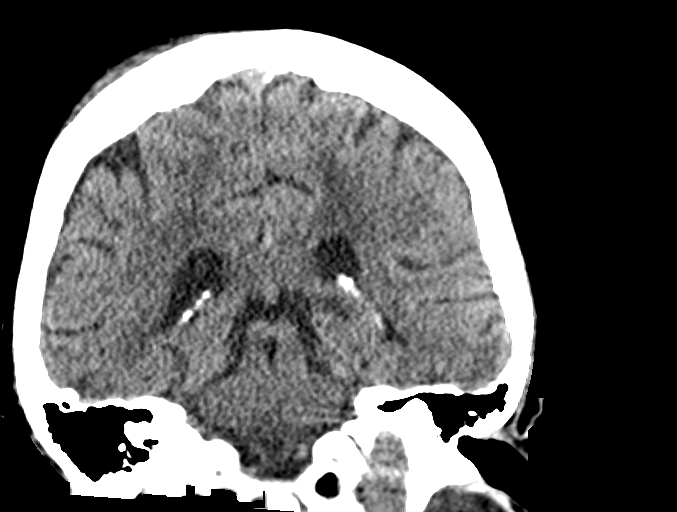

[Series 5: sagittal soft tissue · sagittal · 0.29mm/px · 2 of 63 slices shown]
[im 21/63  brain]
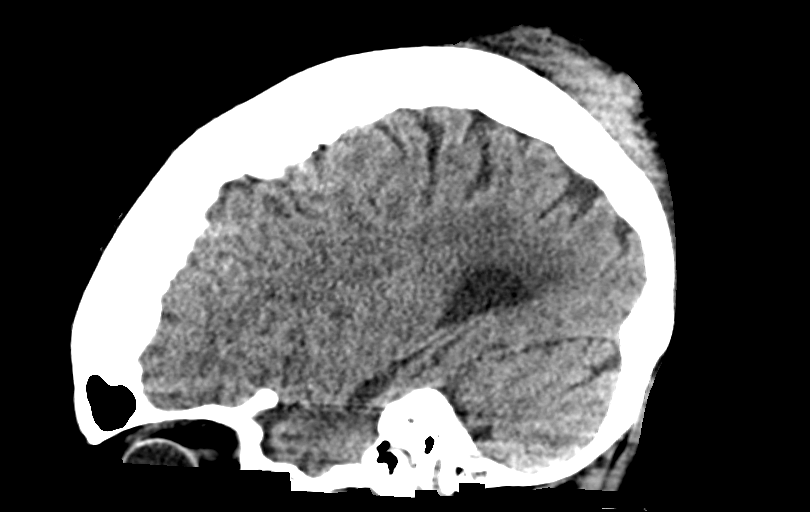
[im 42/63  brain]
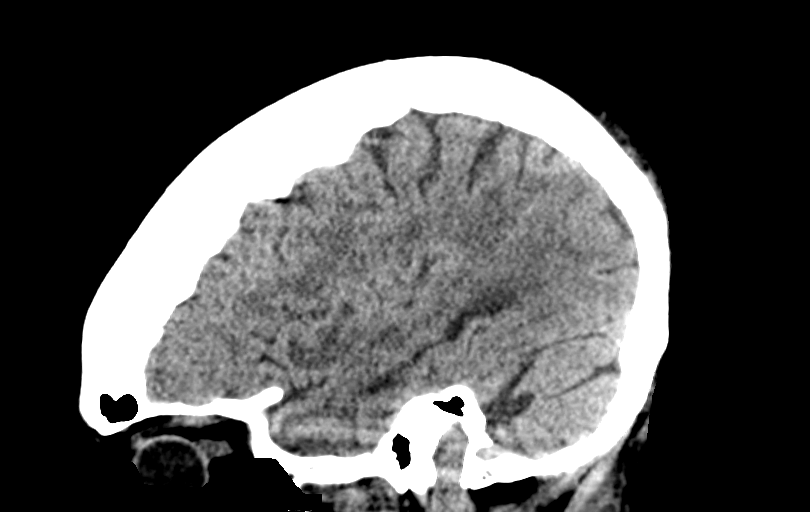

[Series 10: orthogonal bone · axial · 0.23mm/px · z∈[+202,+334]mm · 8 of 100 slices shown]
[im 10/100  bone]
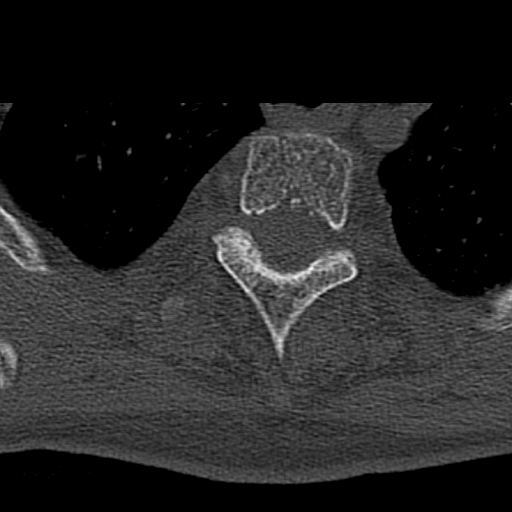
[im 20/100  bone]
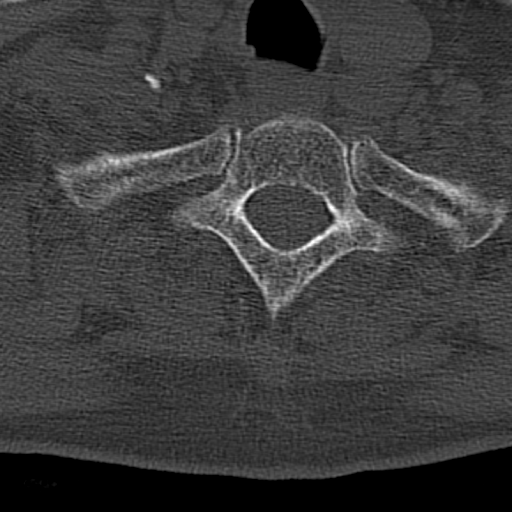
[im 30/100  bone]
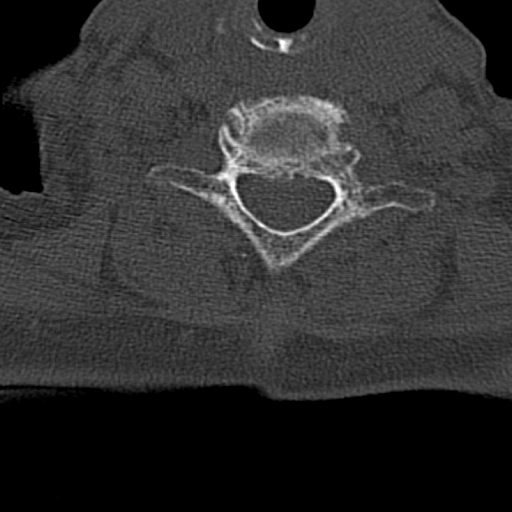
[im 40/100  bone]
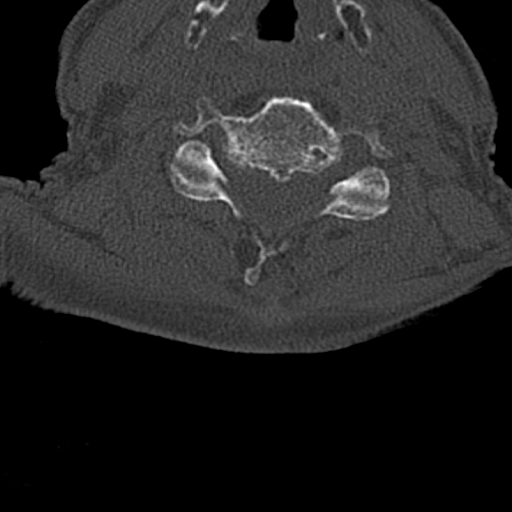
[im 60/100  bone]
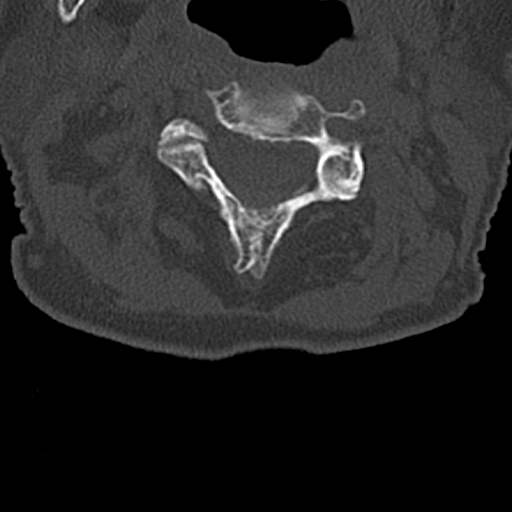
[im 70/100  bone]
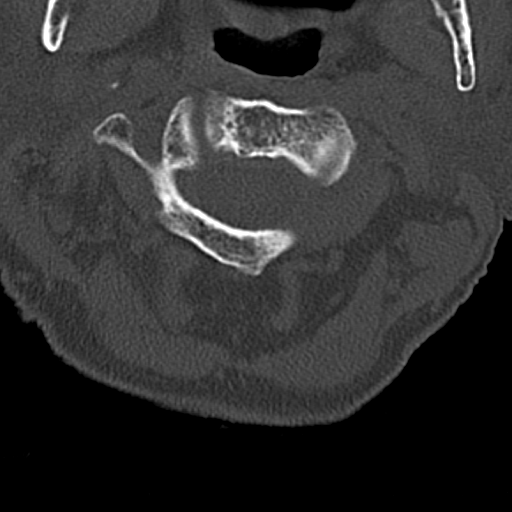
[im 80/100  bone]
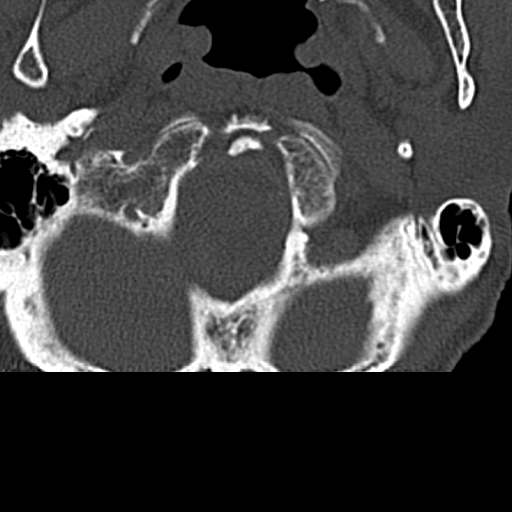
[im 90/100  bone]
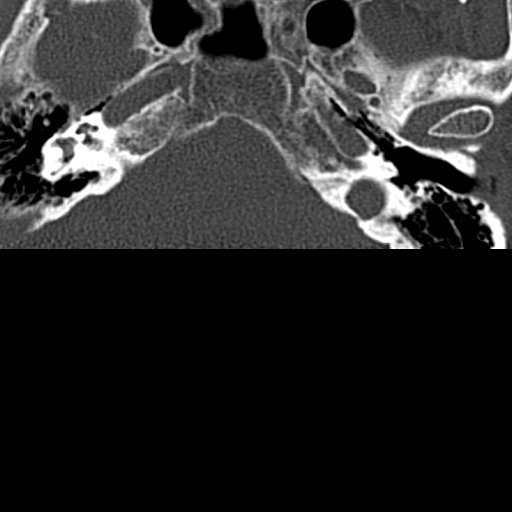

[16 of 47 positions shown; findings below may reference images not displayed]

FINDINGS: CT HEAD FINDINGS

Brain: Minimal atrophy. Normal ventricular morphology. No midline
shift or mass effect. Small vessel chronic ischemic changes of deep
cerebral white matter. No intracranial hemorrhage0, mass lesion, or
evidence of acute infarction. No extra-axial fluid collections.

Vascular: No hyperdense vessels. Atherosclerotic calcification of
internal carotid arteries at skull base.

Skull: Hyperostosis frontalis interna. Large posterior RIGHT vertex
scalp hematoma. Calvaria intact.

Sinuses/Orbits: Clear

Other: N/A

CT CERVICAL SPINE FINDINGS

Alignment: Minimal anterolisthesis at C4-C5 and retrolisthesis
C5-C6. Remaining alignments normal.

Skull base and vertebrae: Osseous demineralization. Skull base
intact. Vertebral body heights maintained. Disc space narrowing and
endplate spur formation C4-C5 through C6-C7. Multilevel facet
degenerative changes. No fracture or bone destruction.

Soft tissues and spinal canal: Prevertebral soft tissues normal
thickness.

Disc levels:  No specific abnormalities

Upper chest: Lung apices clear

Other: N/A
IMPRESSION: Atrophy with small vessel chronic ischemic changes of deep cerebral
white matter.

No acute intracranial abnormalities.

Large RIGHT posterior vertex scalp hematoma.

Multilevel degenerative disc and facet disease changes of the
cervical spine.

No acute cervical spine abnormalities.

## 2023-01-23 DEATH — deceased
